# Patient Record
Sex: Female | Born: 1950
Health system: Southern US, Community
[De-identification: ages and names within clinical notes are randomized; demographics above are authoritative.]

## PROBLEM LIST (undated history)

## (undated) DIAGNOSIS — Z9889 Other specified postprocedural states: Secondary | ICD-10-CM

## (undated) DIAGNOSIS — R112 Nausea with vomiting, unspecified: Secondary | ICD-10-CM

## (undated) DIAGNOSIS — K219 Gastro-esophageal reflux disease without esophagitis: Secondary | ICD-10-CM

## (undated) DIAGNOSIS — R42 Dizziness and giddiness: Secondary | ICD-10-CM

## (undated) DIAGNOSIS — M1990 Unspecified osteoarthritis, unspecified site: Secondary | ICD-10-CM

## (undated) HISTORY — PX: REDUCTION MAMMAPLASTY: SUR839

## (undated) HISTORY — DX: Nausea with vomiting, unspecified: Z98.890

## (undated) HISTORY — PX: HERNIA REPAIR: SHX51

## (undated) HISTORY — DX: Nausea with vomiting, unspecified: R11.2

## (undated) HISTORY — PX: TONSILLECTOMY AND ADENOIDECTOMY: SUR1326

---

## 1999-03-04 HISTORY — PX: ABDOMINAL HYSTERECTOMY: SHX81

## 1999-06-28 ENCOUNTER — Encounter: Admission: RE | Admit: 1999-06-28 | Discharge: 1999-06-28 | Payer: Self-pay | Admitting: Obstetrics and Gynecology

## 1999-06-28 ENCOUNTER — Encounter: Payer: Self-pay | Admitting: Obstetrics and Gynecology

## 1999-07-04 ENCOUNTER — Other Ambulatory Visit: Admission: RE | Admit: 1999-07-04 | Discharge: 1999-07-04 | Payer: Self-pay | Admitting: Obstetrics and Gynecology

## 1999-07-04 ENCOUNTER — Encounter (INDEPENDENT_AMBULATORY_CARE_PROVIDER_SITE_OTHER): Payer: Self-pay

## 2000-08-04 ENCOUNTER — Other Ambulatory Visit: Admission: RE | Admit: 2000-08-04 | Discharge: 2000-08-04 | Payer: Self-pay | Admitting: Obstetrics and Gynecology

## 2001-05-13 ENCOUNTER — Encounter (INDEPENDENT_AMBULATORY_CARE_PROVIDER_SITE_OTHER): Payer: Self-pay | Admitting: Specialist

## 2001-05-13 ENCOUNTER — Inpatient Hospital Stay (HOSPITAL_COMMUNITY): Admission: RE | Admit: 2001-05-13 | Discharge: 2001-05-14 | Payer: Self-pay | Admitting: Obstetrics and Gynecology

## 2002-02-11 ENCOUNTER — Encounter: Payer: Self-pay | Admitting: Obstetrics and Gynecology

## 2002-02-11 ENCOUNTER — Encounter: Admission: RE | Admit: 2002-02-11 | Discharge: 2002-02-11 | Payer: Self-pay | Admitting: Obstetrics and Gynecology

## 2003-02-20 ENCOUNTER — Encounter: Admission: RE | Admit: 2003-02-20 | Discharge: 2003-02-20 | Payer: Self-pay | Admitting: Obstetrics and Gynecology

## 2003-03-04 HISTORY — PX: BREAST SURGERY: SHX581

## 2003-09-21 ENCOUNTER — Encounter (INDEPENDENT_AMBULATORY_CARE_PROVIDER_SITE_OTHER): Payer: Self-pay | Admitting: *Deleted

## 2003-09-21 ENCOUNTER — Ambulatory Visit (HOSPITAL_BASED_OUTPATIENT_CLINIC_OR_DEPARTMENT_OTHER): Admission: RE | Admit: 2003-09-21 | Discharge: 2003-09-21 | Payer: Self-pay | Admitting: Plastic Surgery

## 2003-09-21 ENCOUNTER — Ambulatory Visit (HOSPITAL_COMMUNITY): Admission: RE | Admit: 2003-09-21 | Discharge: 2003-09-21 | Payer: Self-pay | Admitting: Plastic Surgery

## 2004-08-01 ENCOUNTER — Encounter: Admission: RE | Admit: 2004-08-01 | Discharge: 2004-08-01 | Payer: Self-pay | Admitting: Obstetrics and Gynecology

## 2004-09-19 ENCOUNTER — Ambulatory Visit: Payer: Self-pay | Admitting: Internal Medicine

## 2005-10-13 ENCOUNTER — Encounter: Admission: RE | Admit: 2005-10-13 | Discharge: 2005-10-13 | Payer: Self-pay | Admitting: Obstetrics and Gynecology

## 2007-06-16 ENCOUNTER — Encounter: Admission: RE | Admit: 2007-06-16 | Discharge: 2007-06-16 | Payer: Self-pay | Admitting: Obstetrics and Gynecology

## 2008-07-27 ENCOUNTER — Encounter: Admission: RE | Admit: 2008-07-27 | Discharge: 2008-07-27 | Payer: Self-pay | Admitting: Obstetrics and Gynecology

## 2008-08-09 ENCOUNTER — Encounter: Admission: RE | Admit: 2008-08-09 | Discharge: 2008-08-09 | Payer: Self-pay | Admitting: Obstetrics and Gynecology

## 2008-09-01 ENCOUNTER — Ambulatory Visit: Payer: Self-pay | Admitting: Internal Medicine

## 2008-09-07 ENCOUNTER — Ambulatory Visit: Payer: Self-pay | Admitting: Internal Medicine

## 2009-03-01 ENCOUNTER — Encounter: Admission: RE | Admit: 2009-03-01 | Discharge: 2009-03-01 | Payer: Self-pay | Admitting: Internal Medicine

## 2009-08-21 ENCOUNTER — Encounter: Admission: RE | Admit: 2009-08-21 | Discharge: 2009-08-21 | Payer: Self-pay | Admitting: Obstetrics and Gynecology

## 2010-03-02 ENCOUNTER — Ambulatory Visit
Admission: RE | Admit: 2010-03-02 | Discharge: 2010-03-02 | Payer: Self-pay | Source: Home / Self Care | Attending: Internal Medicine | Admitting: Internal Medicine

## 2010-03-02 DIAGNOSIS — M949 Disorder of cartilage, unspecified: Secondary | ICD-10-CM

## 2010-03-02 DIAGNOSIS — K219 Gastro-esophageal reflux disease without esophagitis: Secondary | ICD-10-CM | POA: Insufficient documentation

## 2010-03-02 DIAGNOSIS — M899 Disorder of bone, unspecified: Secondary | ICD-10-CM | POA: Insufficient documentation

## 2010-04-04 NOTE — Assessment & Plan Note (Signed)
Summary: COLD AND CONGESTION/EVM   Vital Signs:  Patient Profile:   60 Years Old Female CC:      Cold symptoms Height:     60 inches Weight:      136 pounds O2 Sat:      98 % O2 treatment:    Room Air Temp:     98.0 degrees F Pulse rate:   87 / minute BP sitting:   127 / 81  (right arm)  Vitals Entered By: Haze Boyden, CMA (March 02, 2010 11:23 AM)                  Current Allergies (reviewed today): ! ERYTHROMYCIN BASE (ERYTHROMYCIN BASE)History of Present Illness Chief Complaint: Cold symptoms x 3 days History of Present Illness: clear nasal discharge has become sl purulent today. occas aches not worsening. cough is dry and not keeping her awake. Partial response to tylenol cold. Exposed to granddaughter with similar and she has responded to amoxil.  REVIEW OF SYSTEMS Constitutional Symptoms      Denies fever, chills, night sweats, weight loss, weight gain, and fatigue.  Eyes       Denies change in vision, eye pain, eye discharge, glasses, contact lenses, and eye surgery. Ear/Nose/Throat/Mouth       Complains of frequent runny nose, sore throat, and hoarseness.      Denies hearing loss/aids, change in hearing, ear pain, ear discharge, dizziness, frequent nose bleeds, sinus problems, and tooth pain or bleeding.      Comments: st and hoarseness better Respiratory       Complains of dry cough.      Denies productive cough, wheezing, shortness of breath, asthma, bronchitis, and emphysema/COPD.  Cardiovascular       Denies murmurs, chest pain, and tires easily with exhertion.    Gastrointestinal       Denies stomach pain, nausea/vomiting, diarrhea, constipation, blood in bowel movements, and indigestion. Genitourniary       Denies painful urination, kidney stones, and loss of urinary control. Neurological       Denies paralysis, seizures, and fainting/blackouts. Musculoskeletal       Complains of joint stiffness.      Denies muscle pain, joint pain, decreased range of  motion, redness, swelling, muscle weakness, and gout.      Comments: actually the ache of current syn. Skin       Denies bruising, unusual mles/lumps or sores, and hair/skin or nail changes.  Psych       Denies mood changes, temper/anger issues, anxiety/stress, speech problems, depression, and sleep problems. Other Comments: The patient denies anorexia, fever, weight loss, weight gain, vision loss, decreased hearing, hoarseness, chest pain, syncope, dyspnea on exertion, peripheral edema, prolonged cough, headaches, hemoptysis, abdominal pain, melena, hematochezia, severe indigestion/heartburn, hematuria, incontinence, genital sores, muscle weakness, suspicious skin lesions, transient blindness, difficulty walking, depression, unusual weight change, abnormal bleeding, enlarged lymph nodes, angioedema, breast masses, and testicular masses.     Past History:  Past Medical History: GERD Osteopenia  Past Surgical History: breast reduction - 2005 Hysterectomy- 2001  Medications Prior to Update: 1)  None  Current Medications (verified): 1)  Oscal 500/200 D-3 500-200 Mg-Unit Tabs (Calcium Carbonate-Vitamin D) .... Yake 1 Tablet Daily 2)  One A Day Multivitamin .... Take 1 Table By Mouth Daily 3)  B12 Over The Counter .... Take 1 Tablet By Mouth Daily 4)  Omeprazole 20 Mg Cpdr (Omeprazole) .... Take 1 Tablet By Mouth As Needed 5)  Biotin 300 Mcg  Tabs (Biotin) .... Take 1 Tablet By Mouth Daily 6)  Amoxicillin 500 Mg Caps (Amoxicillin) .Marland Kitchen.. 1 By Mouth Tid  Allergies (verified): 1)  ! 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 Married Never Smoked Alcohol use-no Drug use-no Regular exercise-no Smoking Status:  never Drug Use:  no Does Patient Exercise:  no  Review of Systems  The patient denies anorexia, fever, weight loss, weight gain, vision loss, decreased  hearing, hoarseness, chest pain, syncope, dyspnea on exertion, peripheral edema, prolonged cough, headaches, hemoptysis, abdominal pain, melena, hematochezia, severe indigestion/heartburn, hematuria, incontinence, genital sores, muscle weakness, suspicious skin lesions, transient blindness, difficulty walking, depression, unusual weight change, abnormal bleeding, enlarged lymph nodes, angioedema, breast masses, and testicular masses.    Physical Exam General appearance: well developed, well nourished, no acute distress Head: normocephalic, atraumatic Eyes: conjunctivae and lids normal Ears: normal, no lesions or deformities Nasal: swollen red turbinates with congestion Oral/Pharynx: tongue normal, posterior pharynx with clear pnd Neck: neck supple,  trachea midline, no masses. sl tender but non palp ac nodes Chest/Lungs: no rales, wheezes, or rhonchi bilateral, breath sounds equal without effort Extremities: normal extremities Neurological: grossly intact and non-focal Skin: no obvious rashes MSE: oriented to time, place, and person Assessment New Problems: OSTEOPENIA (ICD-733.90) GERD (ICD-530.81) UPPER RESPIRATORY INFECTION, ACUTE (ICD-465.9)   Plan New Medications/Changes: AMOXICILLIN 500 MG CAPS (AMOXICILLIN) 1 by mouth tid  #30 x 0, 03/02/2010, J. Juline Patch MD   The patient and/or caregiver has been counseled thoroughly with regard to medications prescribed including dosage, schedule, interactions, rationale for use, and possible side effects and they verbalize understanding.  Diagnoses and expected course of recovery discussed and will return if not improved as expected or if the condition worsens. Patient and/or caregiver verbalized understanding.  Prescriptions: AMOXICILLIN 500 MG CAPS (AMOXICILLIN) 1 by mouth tid  #30 x 0   Entered and Authorized by:   J. Juline Patch MD   Signed by:   Shela Commons. Juline Patch MD on 03/02/2010   Method used:   Electronically to        [id number] (ret[facility])       [address]       [place], [place]  [address]       Ph: [phone]       Fax: [phone]   RxID:   [id number]   Patient Instructions: 1)  Get plenty of rest, drink lots of clear liquids, and use Tylenol or Ibuprofen for fever and comfort. Return in 7-10 days if you're not better:sooner if you're feeling worse. 2)  loratidine-D every 12 hours. 3)  afrin generic 2 sprays in each nostril every 12 hours for 3 days maximum followed by 3 days of non-use. Continue cycle as needed to control nasal or ear congestion . 4)  start antibiotic if worse and take it all. 5)  Please schedule a follow-up appointment as needed. Dr.Gates

## 2010-07-19 NOTE — Op Note (Signed)
NAME:  Mandy Guzman, Mandy Guzman                        ACCOUNT NO.:  [id number]   MEDICAL RECORD NO.:  [id number]                   PATIENT TYPE:  AMB   LOCATION:  DSC                                  FACILITY:  [facility]   PHYSICIAN:  Alfredia Ferguson, M.D.               DATE OF BIRTH:  1950/04/01   DATE OF PROCEDURE:  09/21/2003  DATE OF DISCHARGE:                                 OPERATIVE REPORT   PREOPERATIVE DIAGNOSIS:  Bilateral symptomatic macromastia.   POSTOPERATIVE DIAGNOSIS:  Bilateral symptomatic macromastia.   PROCEDURE:  Bilateral reduction mammoplasty with inferior pedicle technique,  608 grams of breast tissue removed from the right breast and 598 grams  removed from the left breast.   SURGEON:  Alfredia Ferguson, M.D.   ASSISTANT:  Vevelyn Francois, R.N.F.A.   ANESTHESIA:  General endotracheal anesthesia.   INDICATIONS FOR PROCEDURE:  This is a 60 year old woman who complains of  many years of symptomatic macromastia including upper back and neck  discomfort.  She has bra strap grooving into her shoulders.  She wishes to  be reduced from her present DD to a B cup if possible.  The patient  understands there are risks associated with, including, but not limited to  overreduction, underreduction, asymmetry, bleeding, infection, hematoma,  seroma, unsightly scarring, the need for secondary surgery, overall  dissatisfaction with the results.  Inspite of these and other risks  discussed with her, she wishes to proceed with a breast reduction.   DESCRIPTION OF PROCEDURE:  On the day prior to surgery, skin marks were  placed outlining a Wise skin pattern for an inferior pedicle technique.  The  patient was taken to the operating room this morning where she was given  general endotracheal anesthesia.  Her chest was prepped with Betadine and  draped with sterile drapes.  Attention was first directed to the left  breast.  A 42 mm diameter circle was drawn around the nipple on the  areola.  An 8 cm wide inferiorly based pedicle was also marked within the confines of  the previously placed Wise pattern skin marks.  The pedicle was  deepithelialized with the exception of the nipple areolar complex.  The  remainder of my incisions were now made and the medial, superior, and  lateral breast flaps including the excess dermoglandular tissue was  dissected off the pectoralis fascia.  The pedicle was left in place with  wide connections to the chest wall after performing the elevation of the  medial, superior, and lateral breast flaps.  Hemostasis was meticulously  maintained.  The excess dermoglandular tissue was dissected away from the  medial breast flap leaving the medial breast flap approximately 4 cm in  thickness.  The excess tissue was further dissected in continuity, thinning  the superior breast flap to approximately 5 cm in thickness.  The excess  tissue dissection continued laterally thinning the lateral breast flap to  approximately 2 to 3 cm in thickness.  Excess tissue was dissected away from  these flaps using electrocautery.  The tissue was passed off for weights.  A  total of 598 grams removed from the left side.  The wound was copiously  irrigated with saline irrigation.  Hemostasis was ensured and the inferior  corner of the medial and lateral breast flap will be united to the  midportion of the inframammary crease incision with a 2-0 Vicryl suture.  The superior corner of the medial and lateral breast flap will be united to  each other with a similar suture.  The nipple areolar complex was placed in  its new location and fixed in position using interrupted 3-0 Monocryl  suture.  The remainder of the incision was temporarily stapled and attention  was directed to her right breast where an identical procedure was performed.  608 grams of breast tissue was removed from the right breast.  After  ensuring hemostasis and irrigation of the right side, closure  was carried  out of the two breasts in simultaneous fashion.  Once the inferior corner of  the medial and lateral breast flaps were united to the inframammary crease  with a 2-0 Vicryl followed by the superior corner, the remainder of the  breast incision was closed.  The inframammary crease incision was closed  using interrupted 3-0 Monocryl sutures for the dermis followed by a running  3-0 Monocryl subcuticular.  The vertical limb of the medial and lateral  breast flaps were united to each other with 3-0 Monocryl sutures for the  dermis.  The nipple areolar complex was fixed in position using interrupted  3-0 Monocryl for the dermis followed by running 4-0 Monocryl subcuticular.  Symmetry appeared to be acceptable, although, the right breast remained ever  so slightly larger than the left breast, but not necessary in my opinion to  further reduce it.  The nipple areolar complex color was excellent with good  capillary refill.  The patient's chest was cleansed and dried and a bulky  dressing was placed over the breast followed by circumferential wrap with an  Ace bandage.  The patient was awakened, extubated, and transported to the  recovery room in satisfactory condition.                                               Alfredia Ferguson, M.D.    WBB/MEDQ  D:  09/21/2003  T:  09/21/2003  Job:  [id number]

## 2010-07-19 NOTE — Op Note (Signed)
[facility]  Patient:    Mandy Guzman, Mandy Guzman Visit Number: [id number] MRN: [id number]          Service Type: GYN Location: 4W [id number] 01 Attending Physician:  Huel Cote Dictated by:   Alvino Chapel, M.D. Proc. Date: 05/13/01 Admit Date:  05/13/2001 Discharge Date: 05/14/2001                             Operative Report  PREOPERATIVE DIAGNOSIS:   Pelvic pressure with prolapse, cystocele and rectocele.  POSTOPERATIVE DIAGNOSIS:  Pelvic pressure with prolapse, cystocele and rectocele.  OPERATION:  Laparoscopically-assisted vaginal hysterectomy and bilateral salpingo-oophorectomy.  Anterior and posterior repair.  SURGEON:  Alvino Chapel, M.D.  ASSISTANT:  Malachi Pro. Ambrose Mantle, M.D.  ANESTHESIA:  General.  ESTIMATED BLOOD LOSS:  200 cc.  INTRAVENOUS FLUIDS:  2700 cc LR.  URINE OUTPUT:  400 cc clear urine.  FINDINGS:  There was a normal uterus noted.  The ovaries were normal bilaterally.  There was a right paratubal cyst noted and the patient did have a shallow posterior cul-de-sac with a large cystocele and a mild rectocele noticed.  DESCRIPTION OF PROCEDURE:  The patient was taken to the operating room where general anesthesia was obtained without difficulty.  She was then prepped and draped in the normal sterile fashion in the dorsal lithotomy position.  A speculum was then placed in the vagina.  The cervix was identified and a Hulka tenaculum was placed within it for uterine manipulation.  A Foley catheter was also placed, and attention was then turned to the patients abdomen where a small infraumbilical incision was made with the scalpel after injection with 0.25% Marcaine locally.  The Verres needle was then introduced into the abdomen and intraperitoneal placement confirmed by opening pressure with gas flow applied.  The pneumoperitoneum was then obtained with approximately 2.5 liters of CO2 gas.  The Verres needle  was removed, and the 10-11 trocar placed within the incision.  The abdomen and pelvis and then inspected with the findings as previously stated.  The two additional trocars were then placed with in each lower quadrant under direct visualization.  They were 5 mm in size.  With these trocars then placed, the uterus was elevated and the left fallopian tube and ovary grasped with an atraumatic grasped and pulled medially.  This exposed the infundibulopelvic ligament nicely and it was then taken down sequentially with the bipolar cutting cautery unit with good hemostasis noted.  The remainder of the broad ligament was then taken down just lateral to the fallopian tube and this was carried down to the level of the round ligament which was also transected with the cautery unit.  The bladder flap was then also created using the bipolar cutting cautery unit and good exposure of the cervix noted.  In a similar fashion, the right fallopian tube and ovary were grasped and retracted medially exposing the infundibulopelvic ligament.  This area was then taken down with the cutting cautery unit down through the infundibulopelvic and the broad ligament to the level of the bladder flap.  The bladder flap was also created on this side and good reflection noted.  At this point, there was no active bleeding noted. Therefore, the instruments were removed from the abdomen.  The trocars were covered with a clean towel and attention turned to the patients vagina.  A weighted speculum was then placed within the vagina.  The Hulka tenaculum was  removed and Jacobson tenaculum was utilized to clamp the anterior and posterior lip of the cervix.  A dilute solution of Pitressin was then instilled just beneath the cervical mucosa circumferentially.  The Bovie cautery was then utilized to create circumferential incision approximately 1 cm back on the cervix itself.  The Mayo scissors were then used to further develop this  dissection of vaginal mucosa from the underlying cervix.  The posterior cul-de-sac was then entered with Mayo scissors and the banana speculum placed within it.  Each uterosacral ligament was then taken down with a slightly curved Zeppelin clamp, transected and suture ligated with 0 Vicryl at each step.  One additional bite was taken down of the broad ligament in order that the anterior cul-de-sac may be reached easier.  Each of these bites were also secured with a suture ligature of 0 Vicryl.  The anterior cul-de-sac was then entered, and the Sims retractor was placed within it. Thus with the uterus isolated the remainder of the cardinal and broad ligament was taken down with sequential bites of the slightly curved Zeppelin clamp.  Each step again was suture ligated with 0 Vicryl.  This was carried until the uterus had very little broad ligament intervening.  The uterus was then delivered from the vagina, and the remaining broad ligament was clamped with Zeppelin clamp, and the uterus completely amputated.  These last clamps were secured with two free ties of 0 Vicryl, and the patient was then observed for bleeding.  There was no active bleeding noted except along the posterior cuff which was secured with two figure-of-eight sutures of 0 Vicryl and became hemostatic.  A pursestring suture was then utilized to close the peritoneum.  With this tied down and no active bleeding note, the uterosacral ligaments which had been previously clamped were tied to one another.  Attention was then turned to the anterior vaginal wall which was clamped at the vaginal cuff with Allis clamps. Midline dissection was then performed of the vaginal mucosa.  At each step, the vaginal mucosa was reflected with Allis clamps.  This was carried up to approximately 1 cm below the urethral meatus.  The pubovesical fascia was then both sharply and bluntly dissected off the vaginal mucosa flaps and the patients cystocele  reduced in that fashion.  With the pubovaginal fascia dissected off the mucosa to the midline several Kelly plication sutures of 0  Vicryl were placed in the fascia itself.  The fascia then had been successfully reapproximated and the excess vaginal mucosa was trimmed away with Metzenbaum scissors.  The remaining vaginal mucosa was then closed with 2-0 Vicryl in a running locked suture.  The vaginal cuff was then closed with 0 Vicryl in a running locked suture.  The posterior wall of the vaginal was then grasped at the introitus with two Allis clamps and a small section of vaginal mucosa trimmed away.  This was then underscored with the Metzenbaum scissors along the midline with the vaginal mucosa flaps being elevated with Allis clamps at each step.  This was carried to approximately 2 inches into the vagina and the underlying rectovaginal fascia was then both sharply and bluntly dissected off the vaginal mucosa flaps.  This fascia was then reapproximated with 0 Vicryl in several interrupted sutures.  Finally, the posterior vaginal wall was closed with 2-0 Vicryl in a running locked fashion. At this point there was no active bleeding noted vaginally.  Therefore, Estrace coated gauze was placed within the vagina for vaginal packing  and attention was then returned to the patients abdomen where the camera was then reintroduced into the umbilical trocar.  There was some bleeding noted at the right aspect of the vaginal cuff.  The _____ was utilized to irrigate copiously the cuff itself and the bipolar cautery unit was then used also to cauterize the areas of bleeding along the right cuff and sidewall.  At the conclusion of the procedure there was no active bleeding noted, and the pelvis and abdomen were irrigated several times.  It was noted that the posterior cul-de-sac was quite shallow.  The 5 mm trocars were then removed under direct visualization and the pneumoperitoneum partially reduced.   Again, the vaginal cuff was examined and found to be hemostatic with much of the pressure of the pneumoperitoneum relieved.  The remainder of the pneumoperitoneum was then evacuated and the umbilical trocar removed.  The umbilical trocar was then closed with figure-of-eight suture of 0 Vicryl in the deep layers and with 3-0 Vicryl in a subcuticular stitch.  The 5 mm trocar sites were also closed with 3-0 Vicryl in a subcuticular stitch.  Sponge, lap and needle counts were correct x2, and the patient was taken to the recovery room in stable condition. Dictated by:   Alvino Chapel, M.D. Attending Physician:  Huel Cote DD:  05/13/01 TD:  05/15/01 Job: [id number]

## 2010-07-19 NOTE — H&P (Signed)
[facility]  Patient:    Mandy Guzman, Mandy Guzman Visit Number: [id number] MRN: [id number]          Service Type: Attending:  Alvino Chapel, M.D. Dictated by:   Alvino Chapel, M.D. Adm. Date:  05/13/01                           History and Physical  DATE OF SURGERY:  May 13, 2001 at 12:30 p.m. in [place] facility.  HISTORY OF PRESENT ILLNESS:  The patient is a 60 year old G2, P2 who has been having an ongoing problem with increasing pressure and discomfort related to her cystocele.  The patient denies any symptoms of stress urinary incontinence and had a urological evaluation prior to this surgery and was not found to have a significant component of stress urinary incontinence.  The patient is essentially menopausal and has been on Prempro for approximately two years. She has had an occasional period over the last two years, however, for the most part has remained amenorrheic.  PAST MEDICAL HISTORY:  None.  PAST SURGICAL HISTORY:  Tonsillectomy.  PAST GYN HISTORY:  No abnormal Pap smears.  PAST OBSTETRICAL HISTORY:  She had two normal spontaneous vaginal deliveries.  CURRENT MEDICATIONS: 1. Prempro 2.5 mg. 2. Nexium.  ALLERGIES:  No known drug allergies.  FAMILY HISTORY:  Insignificant.  PHYSICAL EXAMINATION:  VITAL SIGNS:  Weight 139 pounds.  Blood pressure 110/80.  CARDIAC:  Regular rate and rhythm.  LUNGS:  Clear to auscultation bilaterally.  ABDOMEN:  Soft and nontender.  PELVIC EXAM:  She has normal external genitalia with a very large cystocele noted, both present with Valsalva and without.  There is also a moderate rectocele and mild uterine prolapse.  IMPRESSION: As stated the patient underwent considerable urological evaluation and was found to have no significant stress urinary incontinence and did not need any formal sling procedure.  For that reason she was consented for a laparoscopic-assisted vaginal  hysterectomy and bilateral salpingo-oophorectomy with anterior-posterior repair.  The patient was counseled of the risks and benefits of this procedure including bleeding, infection and possible damage to bowel and bladder.  She understands these risks and desires to proceed. The patient definitively wishes her ovaries removed and, therefore, they will be removed laparoscopically prior to the vaginal hysterectomy. Dictated by:   Alvino Chapel, M.D. Attending:  Alvino Chapel, M.D. DD:  05/10/01 TD:  05/10/01 Job: [id number]

## 2010-07-19 NOTE — Discharge Summary (Signed)
[facility]  Patient:    Mandy Guzman, Mandy Guzman Visit Number: [id number] MRN: [id number]          Service Type: GYN Location: 4W [id number] 01 Attending Physician:  Oliver Pila Dictated by:   Alvino Chapel, M.D. Admit Date:  05/13/2001 Discharge Date: 05/14/2001                             Discharge Summary  DISCHARGE DIAGNOSES: 1. Pelvic prolapse. 2. Cystocele. 3. Rectocele. 4. Status post laparoscopic-assisted vaginal hysterectomy, bilateral    salpingo-oophorectomy, and anterior-posterior repair.  DISCHARGE MEDICATIONS: 1. Motrin 600 mg p.o. every 6 hours. 2. Percocet one to two tablets p.o. every 4 hours p.r.n.  DISCHARGE FOLLOW-UP:  Patient is to follow up in the office in approximately two weeks for her postoperative check.  HOSPITAL COURSE:  Patient is a 60 year old, G2, P2 who had an ongoing problem with increasing pressure and discomfort related to her pelvic prolapse and cystocele.  For this reason, she underwent a scheduled laparoscopcic-assisted vaginal hysterectomy, bilateral salpingo-oophorectomy, anterior, posterior repair.  Urological evaluation prior to surgery did not reveal any urethral hypermobility.  Patient underwent surgery without complication and was then admitted for routine postoperative care.  The evening following surgery, she did have some hypotension with blood pressure noted to be 80s to 90s/40s to 50s.  However, pulse rate was stable and urine output excellent.  Hemoglobin checked in the recovery room was normal at 11.2.  Preoperative hemoglobin was 13.9.  On postoperative day #1, the patient was feeling very well.  She had no nausea or vomiting.  Postoperative hemoglobin on that morning was 10.6.  Urine output was excellent at 2400 cc.  Incisions were clear.  Throughout the day, she transitioned well to a regular diet with no nausea and vomiting, and was able to take her medications by mouth.  Her  blood pressures improved to her normal 100s/60s and she was able to void without difficulty.  For this reason, she was felt stable for discharge and was discharged to home to follow up in two weeks in the office. Dictated by:   Alvino Chapel, M.D. Attending Physician:  Oliver Pila DD:  05/14/01 TD:  05/17/01 Job: [phone]

## 2010-09-26 ENCOUNTER — Other Ambulatory Visit: Payer: Self-pay | Admitting: Obstetrics and Gynecology

## 2010-09-26 DIAGNOSIS — Z1231 Encounter for screening mammogram for malignant neoplasm of breast: Secondary | ICD-10-CM

## 2010-10-03 ENCOUNTER — Ambulatory Visit
Admission: RE | Admit: 2010-10-03 | Discharge: 2010-10-03 | Disposition: A | Discharge status: Home / Self Care | Payer: BC Managed Care – PPO | Source: Ambulatory Visit | Attending: Obstetrics and Gynecology | Admitting: Obstetrics and Gynecology

## 2010-10-03 DIAGNOSIS — Z1231 Encounter for screening mammogram for malignant neoplasm of breast: Secondary | ICD-10-CM

## 2011-11-21 ENCOUNTER — Other Ambulatory Visit: Payer: Self-pay | Admitting: Obstetrics and Gynecology

## 2011-11-21 DIAGNOSIS — Z1231 Encounter for screening mammogram for malignant neoplasm of breast: Secondary | ICD-10-CM

## 2011-12-08 ENCOUNTER — Ambulatory Visit
Admission: RE | Admit: 2011-12-08 | Discharge: 2011-12-08 | Disposition: A | Discharge status: Home / Self Care | Payer: BC Managed Care – PPO | Source: Ambulatory Visit | Attending: Obstetrics and Gynecology | Admitting: Obstetrics and Gynecology

## 2011-12-08 DIAGNOSIS — Z1231 Encounter for screening mammogram for malignant neoplasm of breast: Secondary | ICD-10-CM

## 2013-02-09 ENCOUNTER — Other Ambulatory Visit: Payer: Self-pay

## 2013-02-09 DIAGNOSIS — Z1231 Encounter for screening mammogram for malignant neoplasm of breast: Secondary | ICD-10-CM

## 2013-03-08 ENCOUNTER — Ambulatory Visit
Admission: RE | Admit: 2013-03-08 | Discharge: 2013-03-08 | Disposition: A | Discharge status: Home / Self Care | Payer: BC Managed Care – PPO | Source: Ambulatory Visit

## 2013-03-08 DIAGNOSIS — Z1231 Encounter for screening mammogram for malignant neoplasm of breast: Secondary | ICD-10-CM

## 2013-09-27 ENCOUNTER — Emergency Department (HOSPITAL_COMMUNITY)
Admission: EM | Admit: 2013-09-27 | Discharge: 2013-09-27 | Disposition: A | Discharge status: Home / Self Care | Payer: BC Managed Care – PPO | Source: Home / Self Care | Attending: Family Medicine | Admitting: Family Medicine

## 2013-09-27 ENCOUNTER — Encounter (HOSPITAL_COMMUNITY): Payer: Self-pay | Admitting: Emergency Medicine

## 2013-09-27 DIAGNOSIS — K625 Hemorrhage of anus and rectum: Secondary | ICD-10-CM

## 2013-09-27 DIAGNOSIS — R197 Diarrhea, unspecified: Secondary | ICD-10-CM

## 2013-09-27 HISTORY — DX: Dizziness and giddiness: R42

## 2013-09-27 LAB — POCT I-STAT, CHEM 8
BUN: 21 mg/dL (ref 6–23)
CREATININE: 0.8 mg/dL (ref 0.50–1.10)
Calcium, Ion: 1.13 mmol/L (ref 1.13–1.30)
Chloride: 101 mEq/L (ref 96–112)
GLUCOSE: 113 mg/dL — AB (ref 70–99)
HCT: 45 % (ref 36.0–46.0)
HEMOGLOBIN: 15.3 g/dL — AB (ref 12.0–15.0)
POTASSIUM: 3.5 meq/L — AB (ref 3.7–5.3)
Sodium: 137 mEq/L (ref 137–147)
TCO2: 24 mmol/L (ref 0–100)

## 2013-09-27 MED ORDER — LOPERAMIDE HCL 2 MG PO CAPS: 2.0000 mg | ORAL_CAPSULE | Freq: Four times a day (QID) | ORAL | Status: DC | PRN

## 2013-09-27 NOTE — ED Provider Notes (Signed)
Medical screening examination/treatment/procedure(s) were performed by a resident physician or non-physician practitioner and as the supervising physician I was immediately available for consultation/collaboration.  David Merrell, MD Family Medicine   David J Merrell, MD 09/27/13 2129 

## 2013-09-27 NOTE — ED Provider Notes (Signed)
CSN: [id number]     Arrival date & time 09/27/13  1641 History   First MD Initiated Contact with Patient 09/27/13 1815     Chief Complaint  Patient presents with  . Rectal Bleeding   (Consider location/radiation/quality/duration/timing/severity/associated sxs/prior Treatment) HPI Comments: 63 year old female presents for evaluation of abdominal cramping with diarrhea and rectal bleeding. This started earlier today, she had some cramping followed by profuse watery diarrhea. This happened a couple more times, and eventually she started to see a very small amount of blood mixed in with the diarrhea.  about 4 hours she had an episode of cramping in which she only experienced a few drops of blood and the toilet, no diarrhea. She called her doctor who could not see her and directed her to come here. Right now, she feels completely fine. She denies any abdominal pain, dizziness, or any other systemic symptoms. She last had an episode of diarrhea after she got here in the waiting room, she reports a very small amount of blood mixed with this. No previous history of bleeding or colitis, and last colonoscopy was 7 years ago and entirely normal, not even any polyps. She is currently taking prednisone for poison ivy, she had diarrhea earlier in the week also right after she started the prednisone   Patient is a 63 y.o. female presenting with hematochezia.  Rectal Bleeding Associated symptoms: abdominal pain   Associated symptoms: no fever and no vomiting     Past Medical History  Diagnosis Date  . Vertigo    Past Surgical History  Procedure Laterality Date  . Abdominal hysterectomy  2001  . Breast surgery  2005    breast reduction   Family History  Problem Relation Age of Onset  . Heart failure Mother    History  Substance Use Topics  . Smoking status: Never Smoker   . Smokeless tobacco: Not on file  . Alcohol Use: No   OB History   Grav Para Term Preterm Abortions TAB SAB Ect Mult Living                 Review of Systems  Constitutional: Negative for fever and chills.  Gastrointestinal: Positive for abdominal pain, diarrhea, blood in stool, hematochezia and anal bleeding. Negative for nausea, vomiting, constipation and rectal pain.  All other systems reviewed and are negative.   Allergies  Erythromycin base  Home Medications   Prior to Admission medications   Medication Sig Start Date End Date Taking? Authorizing Provider  calcium carbonate (OS-CAL) 600 MG TABS tablet Take 600 mg by mouth daily with breakfast.   Yes Historical Provider, MD  Cholecalciferol (VITAMIN D) 1000 UNITS capsule Take 1,000 Units by mouth daily.   Yes Historical Provider, MD  Cyanocobalamin (VITAMIN B 12 PO) Take by mouth 2 (two) times a week.   Yes Historical Provider, MD  Multiple Vitamins-Minerals (MULTIVITAMIN WITH MINERALS) tablet Take 1 tablet by mouth daily. One a Day 50 plus   Yes Historical Provider, MD  omeprazole (PRILOSEC) 20 MG capsule Take 20 mg by mouth daily as needed.   Yes Historical Provider, MD  predniSONE (DELTASONE) 10 MG tablet Take 10 mg by mouth daily with breakfast.   Yes Historical Provider, MD  loperamide (IMODIUM) 2 MG capsule Take 1 capsule (2 mg total) by mouth 4 (four) times daily as needed for diarrhea or loose stools. 09/27/13   Adrian BlackwaterZachary H Madex Seals, PA-C   BP 147/78  Pulse 69  Temp(Src) 98.1 F (36.7 C) (Oral)  Resp 16  SpO2 100% Physical Exam  Nursing note and vitals reviewed. Constitutional: She is oriented to person, place, and time. Vital signs are normal. She appears well-developed and well-nourished. No distress.  HENT:  Head: Normocephalic and atraumatic.  Pulmonary/Chest: Effort normal. No respiratory distress.  Abdominal: Soft. Bowel sounds are normal. She exhibits no distension and no mass. There is no tenderness. There is no rebound and no guarding.  Genitourinary: Rectal exam shows external hemorrhoid and fissure (small). Rectal exam shows no internal  hemorrhoid. Guaiac positive stool.   The skin surrounding the rectum is erythematous, indurated. The rectal mucosa is erythematous the endoscope, no active bleeding identified, no obvious internal hemorrhoid. No tenderness or masses via DRE.  FOBT positive . Stool in the rectal vault with only a very small amount of blood separate from the stool, not mixed in  Neurological: She is alert and oriented to person, place, and time. She has normal strength. Coordination normal.  Skin: Skin is warm and dry. No rash noted. She is not diaphoretic.  Psychiatric: She has a normal mood and affect. Judgment normal.    ED Course  Procedures (including critical care time) Labs Review Labs Reviewed  POCT I-STAT, CHEM 8 - Abnormal; Notable for the following:    Potassium 3.5 (*)    Glucose, Bld 113 (*)    Hemoglobin 15.3 (*)    All other components within normal limits  OCCULT BLOOD X 1 CARD TO LAB, STOOL    Imaging Review No results found.   MDM   1. Rectal bleeding   2. Diarrhea    H/H normal.  Bleeding minimal.  Exam benign.  Diarrhea may be ADR to prednisone and she has a small amount of bleeding from irritation. The bleeding sounds very minimal as per her history, and she is not orthostatic to suggest any amount of life-threatening bleeding. Also stool was present in the rectal vault with only a very small amount of blood, separate from the stool, not mixed in. She has a gastroenterologist, she will followup there ASAP. She will to the emergency room if she has any worsening of her condition. Imodium for diarrhea. Stop prednisone  New Prescriptions   LOPERAMIDE (IMODIUM) 2 MG CAPSULE    Take 1 capsule (2 mg total) by mouth 4 (four) times daily as needed for diarrhea or loose stools.       Graylon Good, PA-C 09/27/13 [phone]

## 2013-09-27 NOTE — ED Notes (Signed)
C/o stool incontinance into underwear this afternoon.  Went to [facility] she had cramp in abdomen and she passed bright red blood x 2.  Does not know if she has a hemorrhoid.  C/o feels bloated.  She called Dr. Kevan NyGates, she told her to go to an Urgent Care.  Had another pain and passed more blood.

## 2013-09-27 NOTE — Discharge Instructions (Signed)
Diarrhea Diarrhea is frequent loose and watery bowel movements. It can cause you to feel weak and dehydrated. Dehydration can cause you to become tired and thirsty, have a dry mouth, and have decreased urination that often is dark yellow. Diarrhea is a sign of another problem, most often an infection that will not last long. In most cases, diarrhea typically lasts 2-3 days. However, it can last longer if it is a sign of something more serious. It is important to treat your diarrhea as directed by your caregiver to lessen or prevent future episodes of diarrhea. CAUSES  Some common causes include:  Gastrointestinal infections caused by viruses, bacteria, or parasites.  Food poisoning or food allergies.  Certain medicines, such as antibiotics, chemotherapy, and laxatives.  Artificial sweeteners and fructose.  Digestive disorders. HOME CARE INSTRUCTIONS  Ensure adequate fluid intake (hydration): Have 1 cup (8 oz) of fluid for each diarrhea episode. Avoid fluids that contain simple sugars or sports drinks, fruit juices, whole milk products, and sodas. Your urine should be clear or pale yellow if you are drinking enough fluids. Hydrate with an oral rehydration solution that you can purchase at pharmacies, retail stores, and online. You can prepare an oral rehydration solution at home by mixing the following ingredients together:   - tsp table salt.   tsp baking soda.   tsp salt substitute containing potassium chloride.  1  tablespoons sugar.  1 L (34 oz) of water.  Certain foods and beverages may increase the speed at which food moves through the gastrointestinal (GI) tract. These foods and beverages should be avoided and include:  Caffeinated and alcoholic beverages.  High-fiber foods, such as raw fruits and vegetables, nuts, seeds, and whole grain breads and cereals.  Foods and beverages sweetened with sugar alcohols, such as xylitol, sorbitol, and mannitol.  Some foods may be well  tolerated and may help thicken stool including:  Starchy foods, such as rice, toast, pasta, low-sugar cereal, oatmeal, grits, baked potatoes, crackers, and bagels.  Bananas.  Applesauce.  Add probiotic-rich foods to help increase healthy bacteria in the GI tract, such as yogurt and fermented milk products.  Wash your hands well after each diarrhea episode.  Only take over-the-counter or prescription medicines as directed by your caregiver.  Take a warm bath to relieve any burning or pain from frequent diarrhea episodes. SEEK IMMEDIATE MEDICAL CARE IF:   You are unable to keep fluids down.  You have persistent vomiting.  You have blood in your stool, or your stools are black and tarry.  You do not urinate in 6-8 hours, or there is only a small amount of very dark urine.  You have abdominal pain that increases or localizes.  You have weakness, dizziness, confusion, or light-headedness.  You have a severe headache.  Your diarrhea gets worse or does not get better.  You have a fever or persistent symptoms for more than 2-3 days.  You have a fever and your symptoms suddenly get worse. MAKE SURE YOU:   Understand these instructions.  Will watch your condition.  Will get help right away if you are not doing well or get worse. Document Released: 02/07/2002 Document Revised: 07/04/2013 Document Reviewed: 10/26/2011 [facility] Patient Information 2015 [place], [place]. This information is not intended to replace advice given to you by your health care provider. Make sure you discuss any questions you have with your health care provider.  Rectal Bleeding Rectal bleeding is when blood passes out of the anus. It is usually a  sign that something is wrong. It may not be serious, but it should always be evaluated. Rectal bleeding may present as bright red blood or extremely dark stools. The color may range from dark red or maroon to black (like tar). It is important that the cause of  rectal bleeding be identified so treatment can be started and the problem corrected. CAUSES   Hemorrhoids. These are enlarged (dilated) blood vessels or veins in the anal or rectal area.  Fistulas. Theseare abnormal, burrowing channels that usually run from inside the rectum to the skin around the anus. They can bleed.  Anal fissures. This is a tear in the tissue of the anus. Bleeding occurs with bowel movements.  Diverticulosis. This is a condition in which pockets or sacs project from the bowel wall. Occasionally, the sacs can bleed.  Diverticulitis. Thisis an infection involving diverticulosis of the colon.  Proctitis and colitis. These are conditions in which the rectum, colon, or both, can become inflamed and pitted (ulcerated).  Polyps and cancer. Polyps are non-cancerous (benign) growths in the colon that may bleed. Certain types of polyps turn into cancer.  Protrusion of the rectum. Part of the rectum can project from the anus and bleed.  Certain medicines.  Intestinal infections.  Blood vessel abnormalities. HOME CARE INSTRUCTIONS  Eat a high-fiber diet to keep your stool soft.  Limit activity.  Drink enough fluids to keep your urine clear or pale yellow.  Warm baths may be useful to soothe rectal pain.  Follow up with your caregiver as directed. SEEK IMMEDIATE MEDICAL CARE IF:  You develop increased bleeding.  You have black or dark red stools.  You vomit blood or material that looks like coffee grounds.  You have abdominal pain or tenderness.  You have a fever.  You feel weak, nauseous, or you faint.  You have severe rectal pain or you are unable to have a bowel movement. MAKE SURE YOU:  Understand these instructions.  Will watch your condition.  Will get help right away if you are not doing well or get worse. Document Released: 08/09/2001 Document Revised: 05/12/2011 Document Reviewed: 08/04/2010 [facility] Patient Information 2015 [place]. This information is not intended to replace advice given to you by your health care provider. Make sure you discuss any questions you have with your health care provider.

## 2013-09-28 ENCOUNTER — Telehealth: Payer: Self-pay | Admitting: Internal Medicine

## 2013-09-28 ENCOUNTER — Ambulatory Visit (INDEPENDENT_AMBULATORY_CARE_PROVIDER_SITE_OTHER): Payer: BC Managed Care – PPO | Admitting: Nurse Practitioner

## 2013-09-28 ENCOUNTER — Encounter: Payer: Self-pay | Admitting: Nurse Practitioner

## 2013-09-28 VITALS — BP 116/76 | HR 88 | Ht 60.0 in | Wt 129.4 lb

## 2013-09-28 DIAGNOSIS — R197 Diarrhea, unspecified: Secondary | ICD-10-CM

## 2013-09-28 DIAGNOSIS — K648 Other hemorrhoids: Secondary | ICD-10-CM

## 2013-09-28 LAB — OCCULT BLOOD, POC DEVICE: Fecal Occult Bld: POSITIVE — AB

## 2013-09-28 MED ORDER — HYDROCORTISONE ACETATE 25 MG RE SUPP: RECTAL | Status: DC

## 2013-09-28 NOTE — Telephone Encounter (Signed)
Patient will come in and see Willette Clusteraula Guenther RNP today at 2:30.  See ER notes from yesterday

## 2013-09-28 NOTE — Patient Instructions (Signed)
We sent a prescription to [facility] [address] [place] 1. Anusol HC Suppositories  We have given you a Low Fiber diet brochure. Avoid dairy for now.  Call Paula's nurse [place] , [phone] with a condition update. If you are not better she will be calling in antibiotics.

## 2013-09-29 DIAGNOSIS — K648 Other hemorrhoids: Secondary | ICD-10-CM | POA: Insufficient documentation

## 2013-09-29 DIAGNOSIS — R197 Diarrhea, unspecified: Secondary | ICD-10-CM | POA: Insufficient documentation

## 2013-09-29 NOTE — Progress Notes (Signed)
HPI :  Patient is a 63 year old female known to Dr. Leone Payor for colon cancer screenings. Patient is worked in today for evaluation of acute diarrhea with blood. Problems started a couple of days ago when patient had a sudden urge to defecate. First bowel movement was formed but then the patient later had a large loose bowel movement with blood. No significant abdominal pain, just some pre-defecatory  cramps. Patient had a few more loose stools with scant blood over the following few hours. She was evaluated at an urgent care center yesterday. I do not see a white count but her hemoglobin was normal at 15.3. After leaving urgent care patient did not have anymore diarrhea or blood. She had no symptoms through the night but around 1:30 this afternoon did pass what she describes as a couple of drops of blood just prior to a bowel movement. No fevers. No recent antibiotic use  Past Medical History  Diagnosis Date  . Vertigo     Family History  Problem Relation Age of Onset  . Heart failure Mother    History  Substance Use Topics  . Smoking status: Never Smoker   . Smokeless tobacco: Not on file  . Alcohol Use: No   Current Outpatient Prescriptions  Medication Sig Dispense Refill  . calcium carbonate (OS-CAL) 600 MG TABS tablet Take 600 mg by mouth daily with breakfast.      . Cholecalciferol (VITAMIN D) 1000 UNITS capsule Take 1,000 Units by mouth daily.      . Cyanocobalamin (VITAMIN B 12 PO) Take by mouth 2 (two) times a week.      . loperamide (IMODIUM) 2 MG capsule Take 1 capsule (2 mg total) by mouth 4 (four) times daily as needed for diarrhea or loose stools.  12 capsule  0  . Multiple Vitamins-Minerals (MULTIVITAMIN WITH MINERALS) tablet Take 1 tablet by mouth daily. One a Day 50 plus      . hydrocortisone (ANUSOL-HC) 25 MG suppository Use 1 suppository at bedtime for 7 days.  7 suppository  1  . omeprazole (PRILOSEC) 20 MG capsule Take 20 mg by mouth daily as needed.       No  current facility-administered medications for this visit.   Allergies  Allergen Reactions  . Erythromycin Base     REACTION: up set stomach    Review of Systems: All systems reviewed and negative except where noted in HPI.   Physical Exam: BP 116/76  Pulse 88  Ht 5' (1.524 m)  Wt 129 lb 6.4 oz (58.695 kg)  BMI 25.27 kg/m2 Constitutional: Pleasant,well-developed, white female in no acute distress. HEENT: Normocephalic and atraumatic. Conjunctivae are normal. No scleral icterus. Neck supple.  Cardiovascular: Normal rate, regular rhythm.  Pulmonary/chest: Effort normal and breath sounds normal. No wheezing, rales or rhonchi. Abdominal: Soft, nondistended, nontender. Bowel sounds active throughout. There are no masses palpable. No hepatomegaly. Rectal:  Inflamed internal hemorrhoid on anoscopy Extremities: no edema Lymphadenopathy: No cervical adenopathy noted. Neurological: Alert and oriented to person place and time. Skin: Skin is warm and dry. No rashes noted. Psychiatric: Normal mood and affect. Behavior is normal.   ASSESSMENT AND PLAN:  69.  63 year old female with acute diarrhea with scant blood. Hemoglobin normal yesterday. Suspect infectious process. Bleeding could be hemorrhoidal. I recommended stool studies but patient lives in [facility] and it would be difficult for her to get the samples back. Hopefully this will be a self-limiting illness. Patient will call Friday morning with  a condition update. If no improvement then will treat empirically with a course of antibiotics. Recommended she avoid dairy products and high fiber foods for the time being.  Further recommendations pending clinical course antibiotics.  2. Internal hemorrhoids. Trial of steroid suppositories.

## 2013-09-29 NOTE — Progress Notes (Signed)
Agree with Ms. Guenther's assessment and plan. Carl E. Gessner, MD, FACG   

## 2013-09-30 ENCOUNTER — Telehealth: Payer: Self-pay | Admitting: *Deleted

## 2013-09-30 NOTE — Telephone Encounter (Signed)
Okay. No need for antibiotics at this point, probably infectious diarrhea. Thanks for update.  Gunnar FusiPaula

## 2013-09-30 NOTE — Telephone Encounter (Signed)
Patient called and left a message for Mandy ClusterPaula Guenther, NP. States she is calling with update. She is doing good. No bleeding at this time. She has not had a bowel movement yet.

## 2013-11-30 ENCOUNTER — Other Ambulatory Visit: Payer: Self-pay | Admitting: Internal Medicine

## 2013-11-30 DIAGNOSIS — R1011 Right upper quadrant pain: Secondary | ICD-10-CM

## 2013-12-09 ENCOUNTER — Ambulatory Visit
Admission: RE | Admit: 2013-12-09 | Discharge: 2013-12-09 | Disposition: A | Discharge status: Home / Self Care | Payer: BC Managed Care – PPO | Source: Ambulatory Visit | Attending: Internal Medicine | Admitting: Internal Medicine

## 2013-12-09 DIAGNOSIS — R1011 Right upper quadrant pain: Secondary | ICD-10-CM

## 2013-12-13 ENCOUNTER — Other Ambulatory Visit (HOSPITAL_COMMUNITY): Payer: Self-pay | Admitting: Internal Medicine

## 2013-12-13 DIAGNOSIS — R1011 Right upper quadrant pain: Secondary | ICD-10-CM

## 2013-12-22 ENCOUNTER — Other Ambulatory Visit: Payer: Self-pay

## 2013-12-22 DIAGNOSIS — Z9889 Other specified postprocedural states: Secondary | ICD-10-CM

## 2013-12-22 DIAGNOSIS — Z1231 Encounter for screening mammogram for malignant neoplasm of breast: Secondary | ICD-10-CM

## 2013-12-27 ENCOUNTER — Ambulatory Visit (HOSPITAL_COMMUNITY)
Admission: RE | Admit: 2013-12-27 | Discharge: 2013-12-27 | Disposition: A | Discharge status: Home / Self Care | Payer: BC Managed Care – PPO | Source: Ambulatory Visit | Attending: Internal Medicine | Admitting: Internal Medicine

## 2013-12-27 DIAGNOSIS — R1011 Right upper quadrant pain: Secondary | ICD-10-CM | POA: Diagnosis present

## 2013-12-27 MED ORDER — TECHNETIUM TC 99M MEBROFENIN IV KIT
5.0000 | PACK | Freq: Once | INTRAVENOUS | Status: AC | PRN
  Administered 2013-12-27: 5 via INTRAVENOUS

## 2013-12-27 MED ORDER — SINCALIDE 5 MCG IJ SOLR
0.0200 ug/kg | Freq: Once | INTRAMUSCULAR | Status: AC
  Administered 2013-12-27: 1.19 ug via INTRAVENOUS

## 2013-12-27 MED ORDER — SINCALIDE 5 MCG IJ SOLR
INTRAMUSCULAR | Status: AC
  Administered 2013-12-27: 1.19 ug via INTRAVENOUS
  Filled 2013-12-27: qty 5

## 2013-12-27 MED ORDER — STERILE WATER FOR INJECTION IJ SOLN
INTRAMUSCULAR | Status: AC
  Filled 2013-12-27: qty 10

## 2014-03-09 ENCOUNTER — Ambulatory Visit
Admission: RE | Admit: 2014-03-09 | Discharge: 2014-03-09 | Disposition: A | Discharge status: Home / Self Care | Payer: BC Managed Care – PPO | Source: Ambulatory Visit

## 2014-03-09 DIAGNOSIS — Z9889 Other specified postprocedural states: Secondary | ICD-10-CM

## 2014-03-09 DIAGNOSIS — Z1231 Encounter for screening mammogram for malignant neoplasm of breast: Secondary | ICD-10-CM

## 2014-04-28 ENCOUNTER — Ambulatory Visit
Admission: RE | Admit: 2014-04-28 | Discharge: 2014-04-28 | Disposition: A | Discharge status: Home / Self Care | Payer: BC Managed Care – PPO | Source: Ambulatory Visit | Attending: Nurse Practitioner | Admitting: Nurse Practitioner

## 2014-04-28 ENCOUNTER — Other Ambulatory Visit: Payer: Self-pay | Admitting: Nurse Practitioner

## 2014-04-28 DIAGNOSIS — R0781 Pleurodynia: Secondary | ICD-10-CM

## 2015-03-15 ENCOUNTER — Other Ambulatory Visit: Payer: Self-pay

## 2015-03-15 DIAGNOSIS — Z1231 Encounter for screening mammogram for malignant neoplasm of breast: Secondary | ICD-10-CM

## 2015-03-20 ENCOUNTER — Ambulatory Visit
Admission: EM | Admit: 2015-03-20 | Discharge: 2015-03-20 | Disposition: A | Discharge status: Home / Self Care | Payer: BC Managed Care – PPO | Attending: Family Medicine | Admitting: Family Medicine

## 2015-03-20 ENCOUNTER — Ambulatory Visit (INDEPENDENT_AMBULATORY_CARE_PROVIDER_SITE_OTHER): Payer: BC Managed Care – PPO

## 2015-03-20 DIAGNOSIS — S61411A Laceration without foreign body of right hand, initial encounter: Secondary | ICD-10-CM

## 2015-03-20 DIAGNOSIS — S61451A Open bite of right hand, initial encounter: Secondary | ICD-10-CM

## 2015-03-20 DIAGNOSIS — W540XXA Bitten by dog, initial encounter: Principal | ICD-10-CM

## 2015-03-20 MED ORDER — AMOXICILLIN-POT CLAVULANATE 875-125 MG PO TABS: 1.0000 | ORAL_TABLET | Freq: Two times a day (BID) | ORAL | Status: DC

## 2015-03-20 MED ORDER — TETANUS-DIPHTH-ACELL PERTUSSIS 5-2.5-18.5 LF-MCG/0.5 IM SUSP
0.5000 mL | Freq: Once | INTRAMUSCULAR | Status: AC
  Administered 2015-03-20: 0.5 mL via INTRAMUSCULAR

## 2015-03-20 MED ORDER — TRAMADOL HCL 50 MG PO TABS: 50.0000 mg | ORAL_TABLET | Freq: Three times a day (TID) | ORAL | Status: DC | PRN

## 2015-03-20 NOTE — ED Notes (Signed)
Pt states she was attempting to give her dog medication and he bit her right hand.Marland Kitchen

## 2015-03-20 NOTE — Discharge Instructions (Signed)
Take medication as prescribed. Keep clean. Clean daily with soap and water, rinse, pat dry and apply thin layer topical antibiotic ointment as discussed. Elevate.   Follow up with your primary care physician this week as needed. Return to Urgent care or go to ER for increased swelling, redness, drainage, pain, numbness, new or worsening concerns.   Animal Bite Animal bites can range from mild to serious. An animal bite can result in a scratch on the skin, a deep open cut, a puncture of the skin, a crush injury, or tearing away of the skin or a body part. A small bite from a house pet will usually not cause serious problems. However, some animal bites can become infected or injure a bone or other tissue.  Bites from certain animals can be more dangerous because of the risk of spreading rabies, which is a serious viral infection. This risk is higher with bites from stray animals or wild animals, such as raccoons, foxes, skunks, and bats. Dogs are responsible for most animal bites. Children are bitten more often than adults. SYMPTOMS  Common symptoms of an animal bite include:   Pain.   Bleeding.   Swelling.   Bruising.  DIAGNOSIS  This condition may be diagnosed based on a physical exam and medical history. Your health care provider will examine the wound and ask for details about the animal and how the bite happened. You may also have tests, such as:   Blood tests to check for infection or to determine if surgery is needed.  X-rays to check for damage to bones or joints.  Culture test. This uses a sample of fluid from the wound to check for infection. TREATMENT  Treatment varies depending on the location and type of animal bite and your medical history. Treatment may include:   Wound care. This often includes cleaning the wound, flushing the wound with saline solution, and applying a bandage (dressing). Sometimes, the wound is left open to heal because of the high risk of infection.  However, in some cases, the wound may be closed with stitches (sutures), staples, skin glue, or adhesive strips.   Antibiotic medicine.   Tetanus shot.   Rabies treatment if the animal could have rabies.  In some cases, bites that have become infected may require IV antibiotics and surgical treatment in the hospital.  HOME CARE INSTRUCTIONS Wound Care  Follow instructions from your health care provider about how to take care of your wound. Make sure you:  Wash your hands with soap and water before you change your dressing. If soap and water are not available, use hand sanitizer.  Change your dressing as told by your health care provider.  Leave sutures, skin glue, or adhesive strips in place. These skin closures may need to be in place for 2 weeks or longer. If adhesive strip edges start to loosen and curl up, you may trim the loose edges. Do not remove adhesive strips completely unless your health care provider tells you to do that.  Check your wound every day for signs of infection. Watch for:   Increasing redness, swelling, or pain.   Fluid, blood, or pus.  General Instructions  Take or apply over-the-counter and prescription medicines only as told by your health care provider.   If you were prescribed an antibiotic, take or apply it as told by your health care provider. Do not stop using the antibiotic even if your condition improves.   Keep the injured area raised (elevated) above the  level of your heart while you are sitting or lying down, if this is possible.   If directed, apply ice to the injured area.   Put ice in a plastic bag.   Place a towel between your skin and the bag.   Leave the ice on for 20 minutes, 2-3 times per day.   Keep all follow-up visits as told by your health care provider. This is important.  SEEK MEDICAL CARE IF:  You have increasing redness, swelling, or pain at the site of your wound.   You have a general feeling of  sickness (malaise).   You feel nauseous or you vomit.   You have pain that does not get better.  SEEK IMMEDIATE MEDICAL CARE IF:  You have a red streak extending away from your wound.   You have fluid, blood, or pus coming from your wound.   You have a fever or chills.   You have trouble moving your injured area.   You have numbness or tingling extending beyond the wound.   This information is not intended to replace advice given to you by your health care provider. Make sure you discuss any questions you have with your health care provider.   Document Released: 11/05/2010 Document Revised: 11/08/2014 Document Reviewed: 07/05/2014 Elsevier Interactive Patient Education 2016 [employer] Inc[employer]  Laceration Care, Adult A laceration is a cut that goes through all of the layers of the skin and into the tissue that is right under the skin. Some lacerations heal on their own. Others need to be closed with stitches (sutures), staples, skin adhesive strips, or skin glue. Proper laceration care minimizes the risk of infection and helps the laceration to heal better. HOW TO CARE FOR YOUR LACERATION If sutures or staples were used:  Keep the wound clean and dry.  If you were given a bandage (dressing), you should change it at least one time per day or as told by your health care provider. You should also change it if it becomes wet or dirty.  Keep the wound completely dry for the first 24 hours or as told by your health care provider. After that time, you may shower or bathe. However, make sure that the wound is not soaked in water until after the sutures or staples have been removed.  Clean the wound one time each day or as told by your health care provider:  Wash the wound with soap and water.  Rinse the wound with water to remove all soap.  Pat the wound dry with a clean towel. Do not rub the wound.  After cleaning the wound, apply a thin layer of antibiotic ointmentas told by  your health care provider. This will help to prevent infection and keep the dressing from sticking to the wound.  Have the sutures or staples removed as told by your health care provider. If skin adhesive strips were used:  Keep the wound clean and dry.  If you were given a bandage (dressing), you should change it at least one time per day or as told by your health care provider. You should also change it if it becomes dirty or wet.  Do not get the skin adhesive strips wet. You may shower or bathe, but be careful to keep the wound dry.  If the wound gets wet, pat it dry with a clean towel. Do not rub the wound.  Skin adhesive strips fall off on their own. You may trim the strips as the wound heals. Do  not remove skin adhesive strips that are still stuck to the wound. They will fall off in time. If skin glue was used:  Try to keep the wound dry, but you may briefly wet it in the shower or bath. Do not soak the wound in water, such as by swimming.  After you have showered or bathed, gently pat the wound dry with a clean towel. Do not rub the wound.  Do not do any activities that will make you sweat heavily until the skin glue has fallen off on its own.  Do not apply liquid, cream, or ointment medicine to the wound while the skin glue is in place. Using those may loosen the film before the wound has healed.  If you were given a bandage (dressing), you should change it at least one time per day or as told by your health care provider. You should also change it if it becomes dirty or wet.  If a dressing is placed over the wound, be careful not to apply tape directly over the skin glue. Doing that may cause the glue to be pulled off before the wound has healed.  Do not pick at the glue. The skin glue usually remains in place for 5-10 days, then it falls off of the skin. General Instructions  Take over-the-counter and prescription medicines only as told by your health care provider.  If you  were prescribed an antibiotic medicine or ointment, take or apply it as told by your doctor. Do not stop using it even if your condition improves.  To help prevent scarring, make sure to cover your wound with sunscreen whenever you are outside after stitches are removed, after adhesive strips are removed, or when glue remains in place and the wound is healed. Make sure to wear a sunscreen of at least 30 SPF.  Do not scratch or pick at the wound.  Keep all follow-up visits as told by your health care provider. This is important.  Check your wound every day for signs of infection. Watch for:  Redness, swelling, or pain.  Fluid, blood, or pus.  Raise (elevate) the injured area above the level of your heart while you are sitting or lying down, if possible. SEEK MEDICAL CARE IF:  You received a tetanus shot and you have swelling, severe pain, redness, or bleeding at the injection site.  You have a fever.  A wound that was closed breaks open.  You notice a bad smell coming from your wound or your dressing.  You notice something coming out of the wound, such as wood or glass.  Your pain is not controlled with medicine.  You have increased redness, swelling, or pain at the site of your wound.  You have fluid, blood, or pus coming from your wound.  You notice a change in the color of your skin near your wound.  You need to change the dressing frequently due to fluid, blood, or pus draining from the wound.  You develop a new rash.  You develop numbness around the wound. SEEK IMMEDIATE MEDICAL CARE IF:  You develop severe swelling around the wound.  Your pain suddenly increases and is severe.  You develop painful lumps near the wound or on skin that is anywhere on your body.  You have a red streak going away from your wound.  The wound is on your hand or foot and you cannot properly move a finger or toe.  The wound is on your hand or foot and  you notice that your fingers or  toes look pale or bluish.   This information is not intended to replace advice given to you by your health care provider. Make sure you discuss any questions you have with your health care provider.   Document Released: 02/17/2005 Document Revised: 07/04/2014 Document Reviewed: 02/13/2014 Elsevier Interactive Patient Education 2016 Else[employer] Inc.  Nonsutured Laceration Care A laceration is a cut that goes through all layers of the skin and extends into the tissue that is right under the skin. This type of cut is usually stitched up (sutured) or closed with tape (adhesive strips) or skin glue shortly after the injury happens. However, if the wound is dirty or if several hours pass before medical treatment is provided, it is likely that germs (bacteria) will enter the wound. Closing a laceration after bacteria have entered it increases the risk of infection. In these cases, your health care provider may leave the laceration open (nonsutured) and cover it with a bandage. This type of treatment helps prevent infection and allows the wound to heal from the deepest layer of tissue damage up to the surface. An open fracture is a type of injury that may involve nonsutured lacerations. An open fracture is a break in a bone that happens along with one or more lacerations through the skin that is near the fracture site. HOW TO CARE FOR YOUR NONSUTURED LACERATION  Take or apply over-the-counter and prescription medicines only as told by your health care provider.  If you were prescribed an antibiotic medicine, take or apply it as told by your health care provider. Do not stop using the antibiotic even if your condition improves.  Clean the wound one time each day or as told by your health care provider.  Wash the wound with mild soap and water.  Rinse the wound with water to remove all soap.  Pat your wound dry with a clean towel. Do not rub the wound.  Do not inject anything into the wound unless your  health care provider told you to.  Change any bandages (dressings) as told by your health care provider. This includes changing the dressing if it gets wet, dirty, or starts to smell bad.  Keep the dressing dry until your health care provider says it can be removed. Do not take baths, swim, or do anything that puts your wound underwater until your health care provider approves.  Raise (elevate) the injured area above the level of your heart while you are sitting or lying down, if possible.  Do not scratch or pick at the wound.  Check your wound every day for signs of infection. Watch for:  Redness, swelling, or pain.  Fluid, blood, or pus.  Keep all follow-up visits as told by your health care provider. This is important. SEEK MEDICAL CARE IF:  You received a tetanus and shot and you have swelling, severe pain, redness, or bleeding at the injection site.   You have a fever.  Your pain is not controlled with medicine.  You have increased redness, swelling, or pain at the site of your wound.  You have fluid, blood, or pus coming from your wound.  You notice a bad smell coming from your wound or your dressing.  You notice something coming out of the wound, such as wood or glass.  You notice a change in the color of your skin near your wound.  You develop a new rash.  You need to change the dressing frequently due to  fluid, blood, or pus draining from the wound.  You develop numbness around your wound. SEEK IMMEDIATE MEDICAL CARE IF:  Your pain suddenly increases and is severe.  You develop severe swelling around the wound.  The wound is on your hand or foot and you cannot properly move a finger or toe.  The wound is on your hand or foot and you notice that your fingers or toes look pale or bluish.  You have a red streak going away from your wound.   This information is not intended to replace advice given to you by your health care provider. Make sure you discuss any  questions you have with your health care provider.   Document Released: 01/15/2006 Document Revised: 07/04/2014 Document Reviewed: 02/13/2014 Elsevier Interactive Patient Education [employer]

## 2015-03-20 NOTE — ED Provider Notes (Signed)
[facility]  ____________________________________________  Time seen: Approximately 1:22 PM  I have reviewed the triage vital signs and the nursing notes.   HISTORY  Chief Complaint Animal Bite   HPI Mandy Guzman is a 65 y.o. female presents with complaints of right hand pain and laceration post dogbite. Patient reports that this morning she was giving her dog, which is about a 40 pound basset hound, his medicine. Patient reports that for her to give him his medicine she has to manually open his mouth and then put the pill in his mouth. Patient states that when he she putting the pill in his mouth he bit down on her hand and she pulled her hand out causing laceration. Patient reports this occurred at approximately 8:30 this morning.  Patient reports that laceration area is mild to moderate tenderness to touch at 4 out of 10. Denies pain radiation. Denies numbness or tingling sensation. Denies difficulty grabbing items or decreased range of motion.  Denies fall or head injury. Denies other pain or injury. Reports unsure of last tetanus immunization. Reports her dog is up-to-date on immunizations, including rabies immunization. Patient reports she is right-hand dominant.  PCP [facility]    Past Medical History  Diagnosis Date  . Vertigo   Osteoarthritis   Patient Active Problem List   Diagnosis Date Noted  . Diarrhea 09/29/2013  . Internal hemorrhoids 09/29/2013  . GERD 03/02/2010  . OSTEOPENIA 03/02/2010    Past Surgical History  Procedure Laterality Date  . Abdominal hysterectomy  2001  . Breast surgery  2005    breast reduction    Current Outpatient Rx  Name  Route  Sig  Dispense  Refill  . calcium carbonate (OS-CAL) 600 MG TABS tablet   Oral   Take 600 mg by mouth daily with breakfast.         . Cholecalciferol (VITAMIN D) 1000 UNITS capsule   Oral   Take 1,000 Units by mouth daily.         . Cyanocobalamin (VITAMIN B 12 PO)   Oral  Take by mouth 2 (two) times a week.         . hydrocortisone (ANUSOL-HC) 25 MG suppository      Use 1 suppository at bedtime for 7 days.   7 suppository   1   . loperamide (IMODIUM) 2 MG capsule   Oral   Take 1 capsule (2 mg total) by mouth 4 (four) times daily as needed for diarrhea or loose stools.   12 capsule   0   . Multiple Vitamins-Minerals (MULTIVITAMIN WITH MINERALS) tablet   Oral   Take 1 tablet by mouth daily. One a Day 50 plus         . omeprazole (PRILOSEC) 20 MG capsule   Oral   Take 20 mg by mouth daily as needed.           Allergies Erythromycin base  Family History  Problem Relation Age of Onset  . Heart failure Mother     Social History Social History  Substance Use Topics  . Smoking status: Never Smoker   . Smokeless tobacco: None  . Alcohol Use: No    Review of Systems Constitutional: No fever/chills Eyes: No visual changes. ENT: No sore throat. Cardiovascular: Denies chest pain. Respiratory: Denies shortness of breath. Gastrointestinal: No abdominal pain.  No nausea, no vomiting.  No diarrhea.  No constipation. Genitourinary: Negative for dysuria. Musculoskeletal: Negative for back pain. Skin: Negative for rash.  right hand laceration and pain.  Neurological: Negative for headaches, focal weakness or numbness.  10-point ROS otherwise negative.  ____________________________________________   PHYSICAL EXAM:  VITAL SIGNS: ED Triage Vitals  Enc Vitals Group     BP 03/20/15 1241 143/86 mmHg     Pulse Rate 03/20/15 1241 84     Resp 03/20/15 1241 18     Temp 03/20/15 1241 97.9 F (36.6 C)     Temp Source 03/20/15 1241 Oral     SpO2 03/20/15 1241 100 %     Weight 03/20/15 1241 111 lb (50.349 kg)     Height 03/20/15 1241  (1.499 m)     Head Cir --      Peak Flow --      Pain Score 03/20/15 1243 4     Pain Loc --      Pain Edu? --      Excl. in GC? --     Constitutional: Alert and oriented. Well appearing and in no  acute distress. Eyes: Conjunctivae are normal. PERRL. EOMI. Head: Atraumatic.  Nose: No congestion/rhinnorhea.  Mouth/Throat: Mucous membranes are moist.  Oropharynx non-erythematous. Cardiovascular: Normal rate, regular rhythm. Grossly normal heart sounds.  Good peripheral circulation. Respiratory: Normal respiratory effort.  No retractions. Lungs CTAB. Gastrointestinal: Soft and nontender.  Musculoskeletal: No lower or upper extremity tenderness nor edema.  No cervical, thoracic or lumbar tenderness to palpation. Bilateral hand grips equal. Bilateral distal radial pulses equal and easily palpable. Except: Right dorsal hand across first and second mid to distal metacarpals, mild to moderate tenderness to palpation with mild to moderate swelling, 2 superficial <0.5 cm puncture marks noted, with a 2.5 cm flap laceration present superficial, no exudate or drainage. No surrounding erythema. NO motor, sensation or tendon deficit to right hand. Cap refill < 2 seconds to right distal fingers.  Neurologic :  Normal speech and language. No gross focal neurologic deficits are appreciated. No gait instability. Skin:  Skin is warm, dry and intact. No rash noted. Psychiatric: Mood and affect are normal. Speech and behavior are normal.  ____________________________________________   LABS (all labs ordered are listed, but only abnormal results are displayed)  Labs Reviewed - No data to display  RADIOLOGY  RIGHT HAND - COMPLETE 3+ VIEW  COMPARISON: 10/19/2009 right hand radiograph.  FINDINGS: There is dorsal right hand soft tissue swelling with associated extensive subcutaneous emphysema. No fracture, dislocation or suspicious focal osseous lesion. There is polyarticular erosive osteoarthritis throughout the interphalangeal joints of the right hand, severely involving the interphalangeal joint of the right common, DIP joints of the second through fourth fingers and PIP joint of the right fifth  finger, with interval progression since 10/19/2009. There is new ankylosis of the right fourth DIP joint. Diffuse osteopenia.  IMPRESSION: 1. Dorsal right hand soft tissue swelling with associated extensive subcutaneous emphysema, a nonspecific finding for which a gas producing cellulitis including necrotizing fasciitis cannot be excluded. 2. No fracture or dislocation. 3. Severe progressive polyarticular erosive osteoarthritis in the interphalangeal joints of the right hand as described.   Electronically Signed By: Delbert Phenix M.D. On: 03/20/2015 13:59  I, Renford Dills, personally viewed and evaluated these images (plain radiographs) as part of my medical decision making, as well as reviewing the written report by the radiologist.  ____________________________________________   PROCEDURES  Procedure(s) performed:  Procedure(s) performed:  Procedure explained and verbal consent obtained. Consent: Verbal consent obtained. Written consent not obtained. Risks and benefits: risks, benefits and alternatives  were discussed Patient identity confirmed: verbally with patient and hospital-assigned identification number  Consent given by: patient   Laceration Repair Location: Right dorsal hand Length: 2.5 cm Foreign bodies: no foreign bodies Tendon involvement: none Nerve involvement: none Preparation: Patient was prepped and draped in the usual sterile fashion. Anesthesia no anesthesia:  Irrigation solution: saline and Betadine  Irrigation method: jet lavage Amount of cleaning: copious Suture repair indicated. Repaired with 2 Steri-Strips Approximation: loose Patient tolerate well. Wound well approximated post repair.  Antibiotic ointment and dressing applied.  Wound care instructions provided.  Observe for any signs of infection or other problems.     ____________________________________________   INITIAL IMPRESSION / ASSESSMENT AND PLAN / ED COURSE  Pertinent  labs & imaging results that were available during my care of the patient were reviewed by me and considered in my medical decision making (see chart for details).  Very well-appearing patient in no acute distress. Presents for the complaint of right dorsal hand pain and laceration post dog bite. Reports this is her dog. Reports that dog is up-to-date on immunizations including rabies shot. Patient immunization status updated. Animal control notified by RN. Right dorsal hand 2.5 cm flap laceration with surrounding 2 superficial puncture marks. Wound cleaned and irrigated as well as symptom Betadine. Right hand x-ray revealing dorsal right hand soft tissue swelling with associated extensive subcutaneous emphysema, no fracture or dislocation, severe progressive polyarticular erosive osteoarthritis, no radiopaque foreign bodies per radiology.   Wound cleaned and irrigated. Laceration flap which is adhering well to skin, no suturing indicated. Wound repaired with 2 Steri-Strips, antibiotic and dressing applied by myself. Will treat patient with oral Augmentin, when necessary ibuprofen and tramadol.  elevate apply ice and monitor closely. Directed in wound cleaning daily. Follow with PCP for wound check this week.  Discussed follow up with Primary care physician this week. Discussed follow up and return to urgent care or ER parameters including increased pain, increased swelling, drainage, redness, fevers, numbness, tingling sensation, no resolution or any worsening concerns. Patient verbalized understanding and agreed to plan.   ____________________________________________   FINAL CLINICAL IMPRESSION(S) / ED DIAGNOSES  Final diagnoses:  Dog bite, hand, right, initial encounter  Hand laceration, right, initial encounter        Renford Dills, NP 03/20/15 1547

## 2015-03-24 ENCOUNTER — Encounter (HOSPITAL_COMMUNITY): Payer: Self-pay | Admitting: Emergency Medicine

## 2015-03-24 ENCOUNTER — Emergency Department (HOSPITAL_COMMUNITY)
Admission: EM | Admit: 2015-03-24 | Discharge: 2015-03-24 | Disposition: A | Discharge status: Home / Self Care | Payer: BC Managed Care – PPO | Source: Home / Self Care | Attending: Emergency Medicine | Admitting: Emergency Medicine

## 2015-03-24 DIAGNOSIS — Z5189 Encounter for other specified aftercare: Secondary | ICD-10-CM | POA: Diagnosis not present

## 2015-03-24 NOTE — Discharge Instructions (Signed)
The wound is healing well. You do not have to replace the Steri-Strips if they fall off. Wash it with soap and water twice a day. You can leave it open, but if you are going to be doing stuff, cover it with a bandage to protect it. Make sure you finish all of your antibiotic. Follow up as needed.

## 2015-03-24 NOTE — ED Notes (Signed)
The patient presented to the [facility] with a complaint of a wound check. The patient stated that she was evaluated at the [facility] on 03/20/2015 for an animal bite and was treated. She presents today to have the wound checked as some of the steri-strips have come off.

## 2015-03-24 NOTE — ED Provider Notes (Signed)
CSN: [id number]     Arrival date & time 03/24/15  1833 History   First MD Initiated Contact with Patient 03/24/15 1845     Chief Complaint  Patient presents with  . Wound Check  . Animal Bite   (Consider location/radiation/quality/duration/timing/severity/associated sxs/prior Treatment) HPI  She is a 65 year old woman here for wound check. She was seen at the [facility] urgent care 4 days ago for a dog bite to her right hand. It was her own dog and is up-to-date on rabies vaccinations. The wound was irrigated and loosely approximated with Steri-Strips. She was started on Augmentin. She has been taking the Augmentin as prescribed. She states one of the Steri-Strips fell off today so she washed it and she was concerned she saw an area of pus. She denies any pain. She will occasionally get some tingling in her digits distal to the injury.  Past Medical History  Diagnosis Date  . Vertigo    Past Surgical History  Procedure Laterality Date  . Abdominal hysterectomy  2001  . Breast surgery  2005    breast reduction   Family History  Problem Relation Age of Onset  . Heart failure Mother    Social History  Substance Use Topics  . Smoking status: Never Smoker   . Smokeless tobacco: None  . Alcohol Use: No   OB History    No data available     Review of Systems As in history of present illness Allergies  Erythromycin base  Home Medications   Prior to Admission medications   Medication Sig Start Date End Date Taking? Authorizing Provider  amoxicillin-clavulanate (AUGMENTIN) 875-125 MG tablet Take 1 tablet by mouth every 12 (twelve) hours. 03/20/15   Renford Dills, NP  calcium carbonate (OS-CAL) 600 MG TABS tablet Take 600 mg by mouth daily with breakfast.    Historical Provider, MD  Cholecalciferol (VITAMIN D) 1000 UNITS capsule Take 1,000 Units by mouth daily.    Historical Provider, MD  Cyanocobalamin (VITAMIN B 12 PO) Take by mouth 2 (two) times a week.    Historical Provider, MD   hydrocortisone (ANUSOL-HC) 25 MG suppository Use 1 suppository at bedtime for 7 days. 09/28/13   Meredith Pel, NP  loperamide (IMODIUM) 2 MG capsule Take 1 capsule (2 mg total) by mouth 4 (four) times daily as needed for diarrhea or loose stools. 09/27/13   Graylon Good, PA-C  Multiple Vitamins-Minerals (MULTIVITAMIN WITH MINERALS) tablet Take 1 tablet by mouth daily. One a Day 50 plus    Historical Provider, MD  omeprazole (PRILOSEC) 20 MG capsule Take 20 mg by mouth daily as needed.    Historical Provider, MD  traMADol (ULTRAM) 50 MG tablet Take 1 tablet (50 mg total) by mouth every 8 (eight) hours as needed (Do not drive or operate machinery while taking as can cause drowsiness.). 03/20/15   Renford Dills, NP   Meds Ordered and Administered this Visit  Medications - No data to display  BP 140/91 mmHg  Pulse 85  Temp(Src) 97.6 F (36.4 C) (Oral)  SpO2 99% No data found.   Physical Exam  Constitutional: She is oriented to person, place, and time. She appears well-developed and well-nourished. No distress.  Cardiovascular: Normal rate.   Pulmonary/Chest: Effort normal.  Neurological: She is alert and oriented to person, place, and time.  Skin:  Flap type laceration to the dorsal right hand. This is healing well. No sign of infection. No drainage.    ED Course  Procedures (  including critical care time)  Labs Review Labs Reviewed - No data to display  Imaging Review No results found.    MDM   1. Visit for wound check    Reassurance provided that the wound is healing well. Discussed if the Steri-Strips fall off again, she does not need to replace them. Wound care as in after visit summary. Bandaged applied prior to discharge.    Charm Rings, MD 03/24/15 Ebony Cargo

## 2015-04-09 ENCOUNTER — Ambulatory Visit
Admission: RE | Admit: 2015-04-09 | Discharge: 2015-04-09 | Disposition: A | Discharge status: Home / Self Care | Payer: BC Managed Care – PPO | Source: Ambulatory Visit

## 2015-04-09 DIAGNOSIS — Z1231 Encounter for screening mammogram for malignant neoplasm of breast: Secondary | ICD-10-CM

## 2015-07-29 IMAGING — US US ABDOMEN COMPLETE
1 series · 14 of 25 positions shown · non-contrast
Comparison: Ultrasound abdomen 03/01/2009

CLINICAL DATA: Right upper quadrant pain

EXAM:
ULTRASOUND ABDOMEN COMPLETE

[Series 1: us abdomen complete · 0.22mm/px · 14 of 83 slices shown]
[im 1/83]
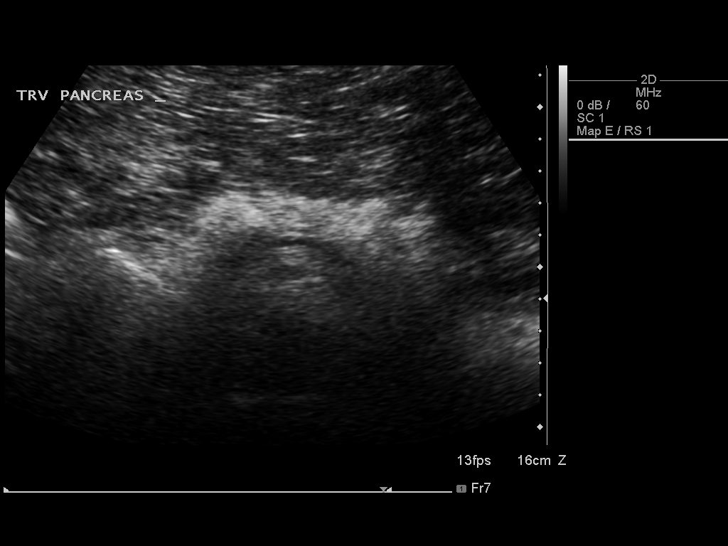
[im 7/83]
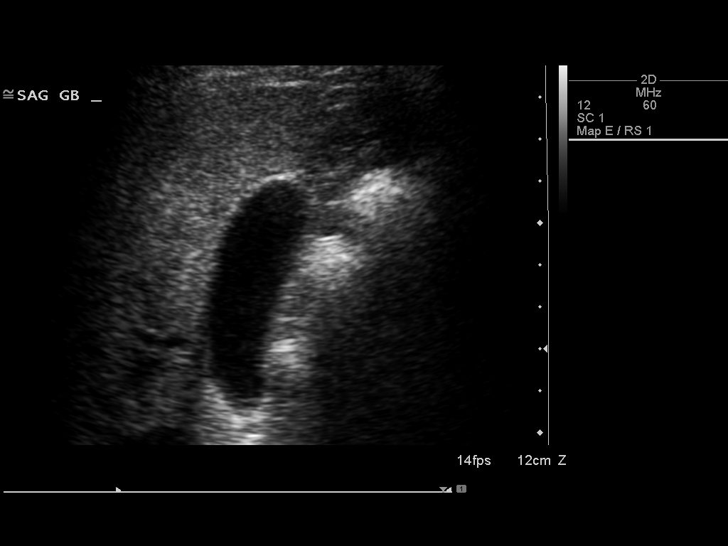
[im 14/83]
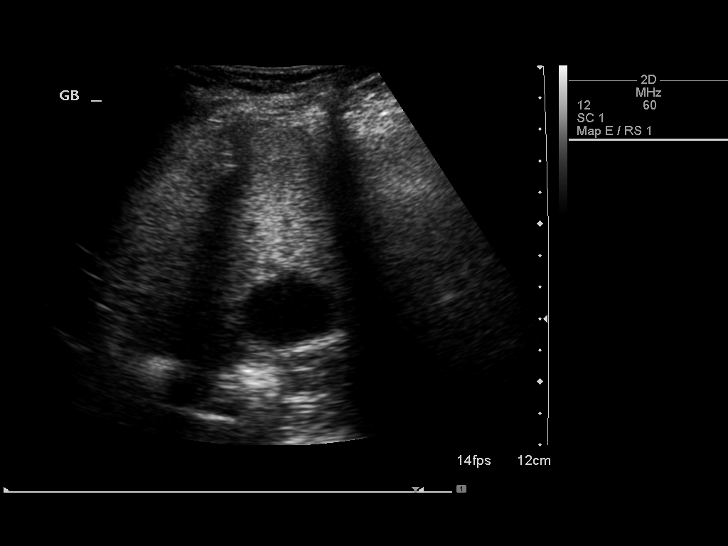
[im 21/83]
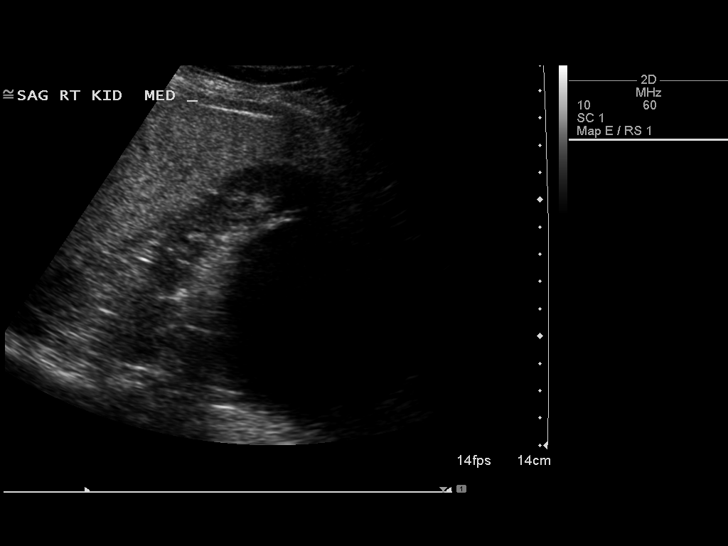
[im 28/83]
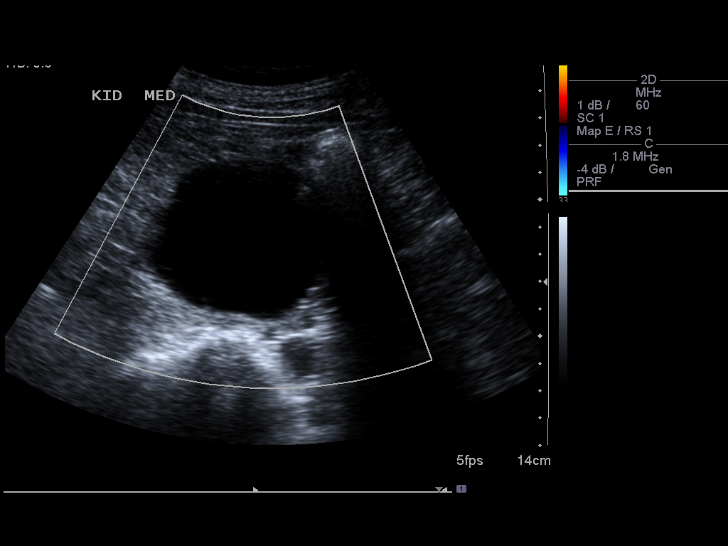
[im 31/83]
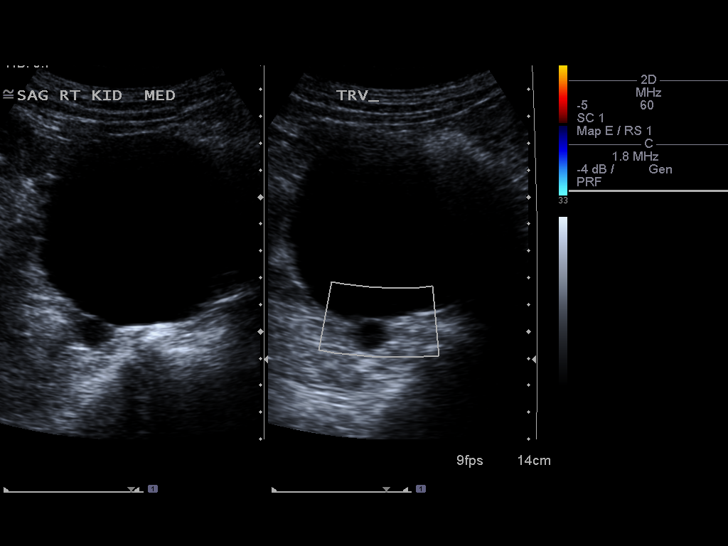
[im 38/83]
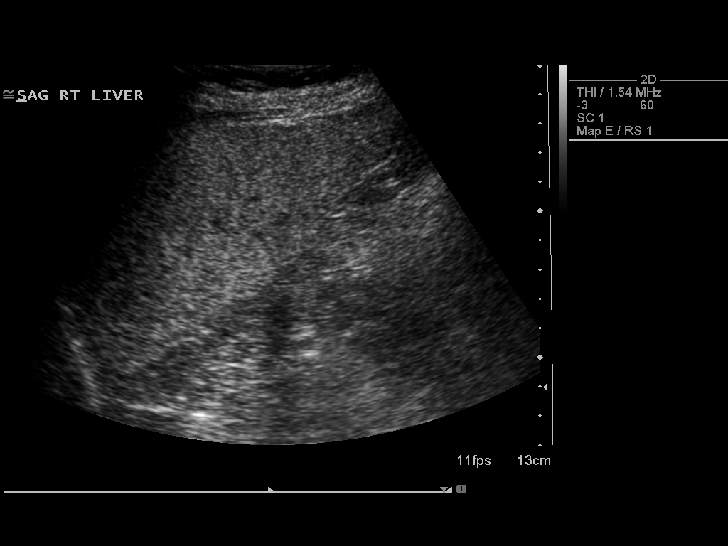
[im 45/83]
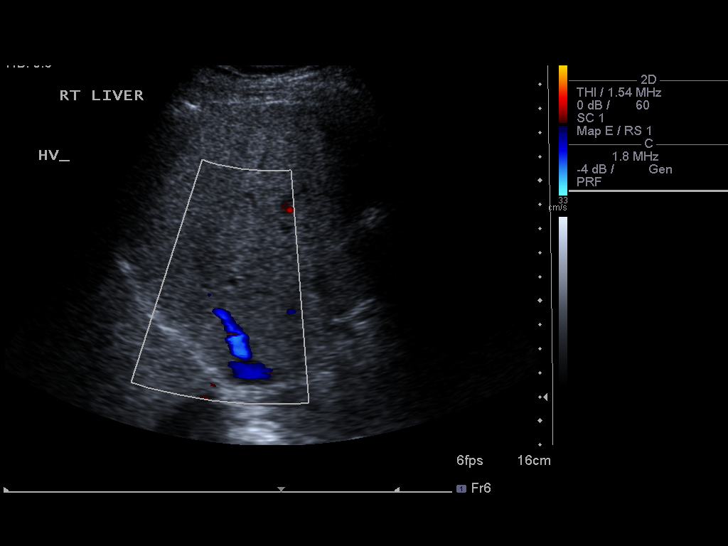
[im 52/83]
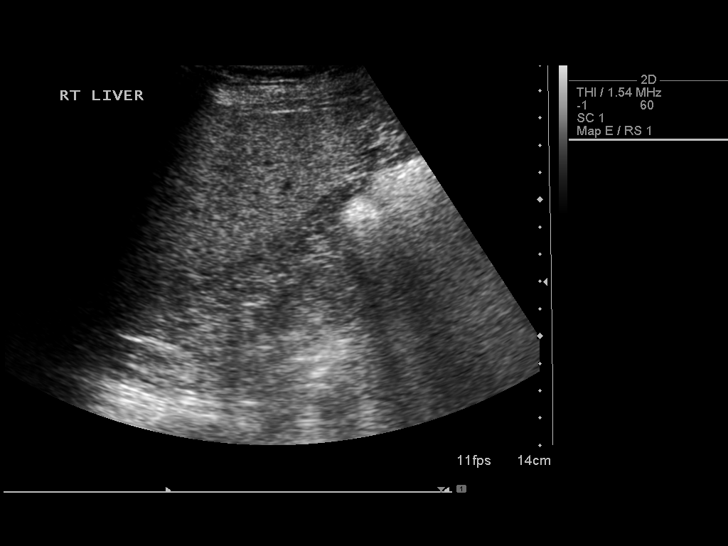
[im 55/83]
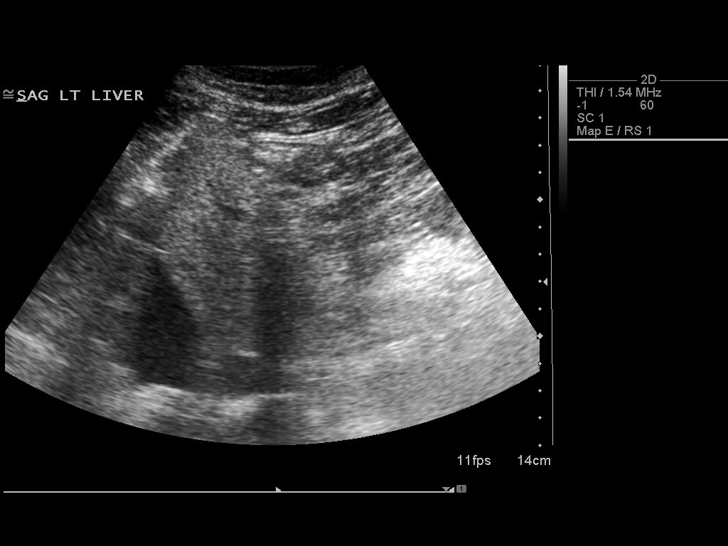
[im 62/83]
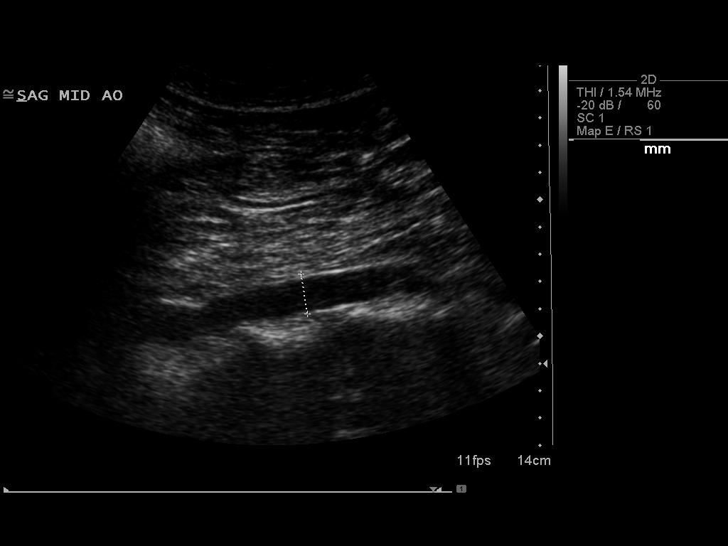
[im 69/83]
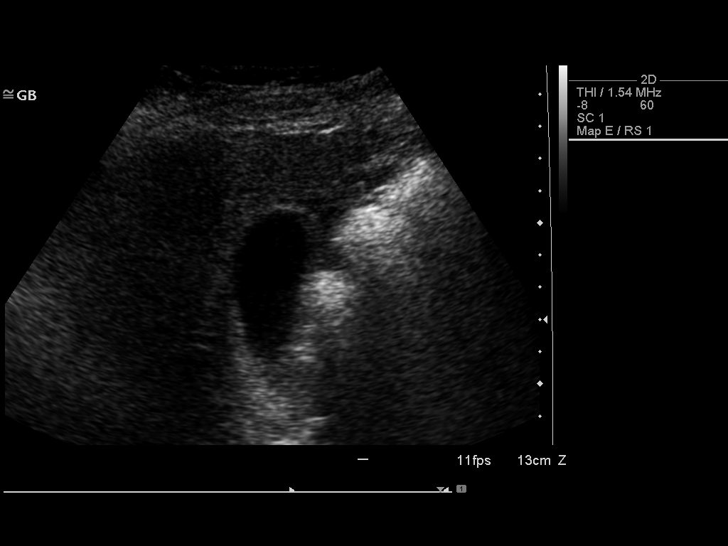
[im 76/83]
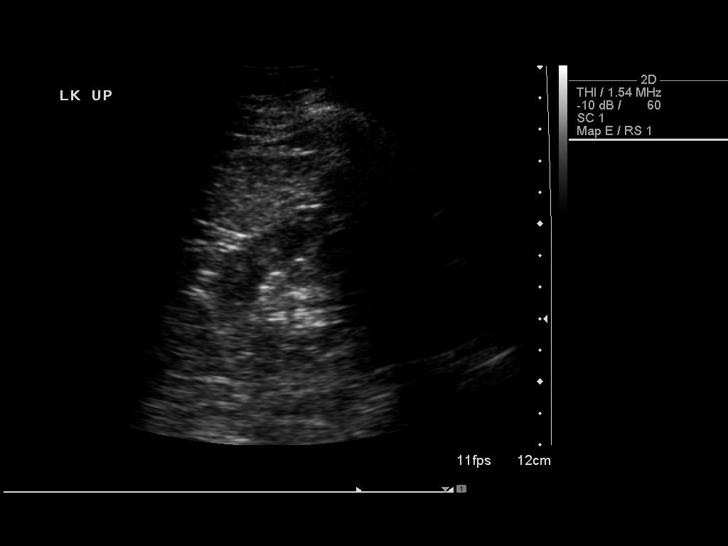
[im 83/83]
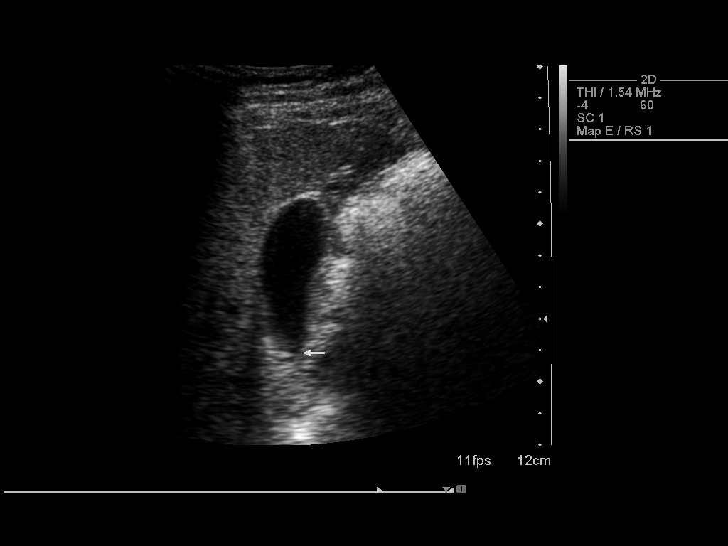

[14 of 25 positions shown; findings below may reference images not displayed]

FINDINGS: Gallbladder: 3.5 mm echogenic focus in the neck. This is non mobile
and could represent stone or polyp.

Common bile duct: Diameter: 3.7 mm

Liver: Hyperechoic liver texture without focal lesion lesion.

IVC: Limited visualization

Pancreas: Limited

Spleen: Size and appearance within normal limits.

Right Kidney: Length: 10.1 cm. Large 8 cm cyst medial to the kidney.
Smaller 16 mm cyst also noted.. Echogenicity within normal limits.
No mass or hydronephrosis visualized.

Left Kidney: Length: 10.7 cm. Echogenicity within normal limits. No
mass or hydronephrosis visualized.

Abdominal aorta: No aneurysm visualized.

Other findings: None.
IMPRESSION: 3.5 mm polyp versus gallstone in the gallbladder neck. Bile ducts
nondilated.

Hyperechoic liver

Large right renal cyst.

## 2016-01-08 ENCOUNTER — Ambulatory Visit: Payer: BC Managed Care – PPO

## 2016-01-08 ENCOUNTER — Ambulatory Visit (INDEPENDENT_AMBULATORY_CARE_PROVIDER_SITE_OTHER): Payer: BC Managed Care – PPO | Admitting: Podiatry

## 2016-01-08 ENCOUNTER — Ambulatory Visit (INDEPENDENT_AMBULATORY_CARE_PROVIDER_SITE_OTHER): Payer: BC Managed Care – PPO

## 2016-01-08 ENCOUNTER — Telehealth: Payer: Self-pay | Admitting: *Deleted

## 2016-01-08 VITALS — BP 113/80 | HR 80 | Temp 98.2°F | Ht 59.0 in | Wt 131.0 lb

## 2016-01-08 DIAGNOSIS — R52 Pain, unspecified: Secondary | ICD-10-CM

## 2016-01-08 DIAGNOSIS — M722 Plantar fascial fibromatosis: Secondary | ICD-10-CM

## 2016-01-08 DIAGNOSIS — M79672 Pain in left foot: Secondary | ICD-10-CM

## 2016-01-08 DIAGNOSIS — M79673 Pain in unspecified foot: Secondary | ICD-10-CM

## 2016-01-08 MED ORDER — NONFORMULARY OR COMPOUNDED ITEM: 1.0000 g | Freq: Four times a day (QID) | 2 refills | Status: DC

## 2016-01-08 MED ORDER — BETAMETHASONE SOD PHOS & ACET 6 (3-3) MG/ML IJ SUSP: 3.0000 mg | Freq: Once | INTRAMUSCULAR | Status: AC

## 2016-01-08 MED ORDER — MELOXICAM 15 MG PO TABS: 15.0000 mg | ORAL_TABLET | Freq: Every day | ORAL | 1 refills | Status: AC

## 2016-01-08 NOTE — Telephone Encounter (Addendum)
Pt states she has not received any information about a cream. Left message informing pt of [facility] order and their information. 01/09/2016-Informed pt, Dr. Logan BoresEvans assistant was processing and [facility] or tomorrow. Pt states understanding.

## 2016-01-08 NOTE — Progress Notes (Signed)
Patient ID: Mandy Guzman, female   DOB: Jul 19, 1950, 65 y.o.   MRN: [id number] Subjective: Patient presents today for pain and tenderness in the left foot. Patient states the foot pain has been hurting for several weeks now. Patient states that it hurts in the mornings with the first steps out of bed. Patient presents today for further treatment and evaluation  Objective: Physical Exam General: The patient is alert and oriented x3 in no acute distress.  Dermatology: Skin is warm, dry and supple bilateral lower extremities. Negative for open lesions or macerations bilateral.   Vascular: Dorsalis Pedis and Posterior Tibial pulses palpable bilateral.  Capillary fill time is immediate to all digits.  Neurological: Epicritic and protective threshold intact bilateral.   Musculoskeletal: Tenderness to palpation at the medial calcaneal tubercale and through the insertion of the plantar fascia of the left foot. All other joints range of motion within normal limits bilateral. Strength 5/5 in all groups bilateral.   Radiographic exam:   Normal osseous mineralization. Joint spaces preserved. No fracture/dislocation/boney destruction. Calcaneal spur present with mild thickening of plantar fascia left. No other soft tissue abnormalities or radiopaque foreign bodies.   Assessment: 1. Plantar fasciitis left foot 2. Pain in left foot  Plan of Care:   1. Patient evaluated. Xrays reviewed.   2. Injection of 0.5cc Celestone soluspan injected into the left plantar fascia.  3. Instructed patient regarding therapies and modalities at home to alleviate symptoms.  4. Rx for meloxicam 15 mg 5. Rx for anti-inflammatory pain cream [facility]  6. Plantar fascial band(s) dispensed. 7. Today the patient was scanned for custom molded orthotics 8. Return to clinic in 4 weeks for orthotic pickup and follow-up evaluation.

## 2016-01-08 NOTE — Telephone Encounter (Signed)
I am in the process of placing orders now

## 2016-01-08 NOTE — Addendum Note (Signed)
Addended by: Renaldo ReelPARRY, MELODY A on: 01/08/2016 05:08 PM   Modules accepted: Orders

## 2016-01-08 NOTE — Progress Notes (Signed)
   Subjective:    Patient ID: Mandy Guzman, female    DOB: [birth year], 65 y.o.   MRN: [id number]  HPI    Review of Systems  Musculoskeletal: Positive for arthralgias.  All other systems reviewed and are negative.      Objective:   Physical Exam        Assessment & Plan:

## 2016-01-29 ENCOUNTER — Ambulatory Visit (INDEPENDENT_AMBULATORY_CARE_PROVIDER_SITE_OTHER): Payer: BC Managed Care – PPO | Admitting: Podiatry

## 2016-01-29 DIAGNOSIS — M79672 Pain in left foot: Secondary | ICD-10-CM

## 2016-01-29 DIAGNOSIS — M722 Plantar fascial fibromatosis: Secondary | ICD-10-CM

## 2016-01-29 NOTE — Patient Instructions (Signed)

## 2016-01-30 NOTE — Progress Notes (Signed)
Subjective: Patient presents today to pick up her orthotics. Patient has no complaints. Patient states that her left foot feels much better after the last injection  Assessment: #1 Plantar fasciitis left foot #2 pain in left foot  Plan of care: #1 orthotics were dispensed today. Instructions for break-in period were provided. #2 return in one month

## 2016-02-04 ENCOUNTER — Encounter: Payer: Self-pay | Admitting: *Deleted

## 2016-02-29 ENCOUNTER — Ambulatory Visit: Payer: BC Managed Care – PPO | Admitting: Podiatry

## 2016-03-24 ENCOUNTER — Other Ambulatory Visit: Payer: Self-pay | Admitting: Obstetrics and Gynecology

## 2016-03-24 DIAGNOSIS — Z1231 Encounter for screening mammogram for malignant neoplasm of breast: Secondary | ICD-10-CM

## 2016-04-18 ENCOUNTER — Ambulatory Visit
Admission: RE | Admit: 2016-04-18 | Discharge: 2016-04-18 | Disposition: A | Discharge status: Home / Self Care | Payer: BC Managed Care – PPO | Source: Ambulatory Visit | Attending: Obstetrics and Gynecology | Admitting: Obstetrics and Gynecology

## 2016-04-18 DIAGNOSIS — Z1231 Encounter for screening mammogram for malignant neoplasm of breast: Secondary | ICD-10-CM

## 2017-03-26 ENCOUNTER — Other Ambulatory Visit: Payer: Self-pay | Admitting: Obstetrics and Gynecology

## 2017-03-26 DIAGNOSIS — Z139 Encounter for screening, unspecified: Secondary | ICD-10-CM

## 2017-04-20 ENCOUNTER — Ambulatory Visit
Admission: RE | Admit: 2017-04-20 | Discharge: 2017-04-20 | Disposition: A | Discharge status: Home / Self Care | Payer: BC Managed Care – PPO | Source: Ambulatory Visit | Attending: Obstetrics and Gynecology | Admitting: Obstetrics and Gynecology

## 2017-04-20 DIAGNOSIS — Z139 Encounter for screening, unspecified: Secondary | ICD-10-CM

## 2017-09-02 DIAGNOSIS — Z79899 Other long term (current) drug therapy: Secondary | ICD-10-CM | POA: Diagnosis not present

## 2017-09-02 DIAGNOSIS — E559 Vitamin D deficiency, unspecified: Secondary | ICD-10-CM | POA: Diagnosis not present

## 2017-09-02 DIAGNOSIS — Z23 Encounter for immunization: Secondary | ICD-10-CM | POA: Diagnosis not present

## 2017-09-02 DIAGNOSIS — E785 Hyperlipidemia, unspecified: Secondary | ICD-10-CM | POA: Diagnosis not present

## 2017-09-02 DIAGNOSIS — Q619 Cystic kidney disease, unspecified: Secondary | ICD-10-CM | POA: Diagnosis not present

## 2017-09-02 DIAGNOSIS — Z0000 Encounter for general adult medical examination without abnormal findings: Secondary | ICD-10-CM | POA: Diagnosis not present

## 2017-09-02 DIAGNOSIS — M1990 Unspecified osteoarthritis, unspecified site: Secondary | ICD-10-CM | POA: Diagnosis not present

## 2017-09-02 DIAGNOSIS — M8580 Other specified disorders of bone density and structure, unspecified site: Secondary | ICD-10-CM | POA: Diagnosis not present

## 2017-09-02 DIAGNOSIS — J309 Allergic rhinitis, unspecified: Secondary | ICD-10-CM | POA: Diagnosis not present

## 2017-09-02 DIAGNOSIS — K219 Gastro-esophageal reflux disease without esophagitis: Secondary | ICD-10-CM | POA: Diagnosis not present

## 2017-09-22 DIAGNOSIS — Z01419 Encounter for gynecological examination (general) (routine) without abnormal findings: Secondary | ICD-10-CM | POA: Diagnosis not present

## 2017-09-22 DIAGNOSIS — N952 Postmenopausal atrophic vaginitis: Secondary | ICD-10-CM | POA: Diagnosis not present

## 2017-09-22 DIAGNOSIS — Z6825 Body mass index (BMI) 25.0-25.9, adult: Secondary | ICD-10-CM | POA: Diagnosis not present

## 2017-10-27 DIAGNOSIS — Z780 Asymptomatic menopausal state: Secondary | ICD-10-CM | POA: Diagnosis not present

## 2018-03-15 DIAGNOSIS — R109 Unspecified abdominal pain: Secondary | ICD-10-CM | POA: Diagnosis not present

## 2018-03-15 DIAGNOSIS — Z1211 Encounter for screening for malignant neoplasm of colon: Secondary | ICD-10-CM | POA: Diagnosis not present

## 2018-03-15 DIAGNOSIS — R194 Change in bowel habit: Secondary | ICD-10-CM | POA: Diagnosis not present

## 2018-04-22 ENCOUNTER — Encounter: Payer: Self-pay | Admitting: Nurse Practitioner

## 2018-04-22 ENCOUNTER — Ambulatory Visit (INDEPENDENT_AMBULATORY_CARE_PROVIDER_SITE_OTHER): Payer: Medicare Other | Admitting: Nurse Practitioner

## 2018-04-22 VITALS — BP 100/80 | HR 80 | Ht 59.0 in | Wt 127.0 lb

## 2018-04-22 DIAGNOSIS — R194 Change in bowel habit: Secondary | ICD-10-CM | POA: Diagnosis not present

## 2018-04-22 DIAGNOSIS — R1033 Periumbilical pain: Secondary | ICD-10-CM

## 2018-04-22 NOTE — Progress Notes (Addendum)
ASSESSMENT / PLAN:   69.  68 year old female with recent onset of periumbilical discomfort associated with bowel changes in the form of softer/smaller caliber stools.  Symptoms resolved after being treated empirically for diverticulitis but in retrospect patient wonders if almond milk may have been the culprit.  After re-challenging almond milk patient began to develop recurrent symptoms.  Since discontinuation of almond milk and resumption of 2% milk she has had no further periumbilical discomfort and her stools are back to being completely normal.  -Hopefully patient has already figured out the etiology of her symptoms.  -She will call for any recurrent symptoms  2.  Colon cancer screening.  Patient is due for 10-year colonoscopy July 2020.   Agree with Ms. 's assessment and plan. Iva Boop, MD, [facility]    HPI:    Chief Complaint: Abdominal pain and bowel changes  Patient is a 68 year old female known remotely to Dr. Leone Payor from colon cancer screening in July 2010.  She is here for evaluation of abdominal pain and bowel changes.  In early January patient developed diffuse periumbilical pain associated with bowel changes described as soft stools, small in caliber.  Mid January patient saw PCP and was treated empirically for diverticulitis.  Her symptoms resolved.  Following that patient wondered if the almond milk she had recently started using in fruit smoothies may have been the actual culprit.  She discontinued almond milk, went back to 2%.  She did rechallenge the almond milk and after just a small amount she began to feel some of the same symptoms.  Patient has totally discontinued almond milk, she has had no recurrent abdominal pain since.  Her bowel movements are back to being completely normal.  Her weight is stable.   Gavin Pound has no other GI complaints.  She does mention occasional RUQ pain but this is chronic/intermittent only related to excessive  consumption of fatty foods.  She had a HIDA scan 12/27/2013 and gallbladder EF was normal.   Data Reviewed:   CBC 03/15/18 -normal   Past Medical History:  Diagnosis Date  . Vertigo      Past Surgical History:  Procedure Laterality Date  . ABDOMINAL HYSTERECTOMY  2001  . BREAST SURGERY  2005   breast reduction  . REDUCTION MAMMAPLASTY Bilateral   . TONSILLECTOMY AND ADENOIDECTOMY     Family History  Problem Relation Age of Onset  . Heart failure Mother    Social History   Tobacco Use  . Smoking status: Never Smoker  . Smokeless tobacco: Never Used  Substance Use Topics  . Alcohol use: No  . Drug use: No   Current Outpatient Medications  Medication Sig Dispense Refill  . Biotin 1000 MCG tablet Take 1,000 mcg by mouth daily.    . calcium carbonate (OS-CAL) 600 MG TABS tablet Take 600 mg by mouth daily with breakfast.    . Cholecalciferol (VITAMIN D) 1000 UNITS capsule Take 1,000 Units by mouth daily.    . Cyanocobalamin (VITAMIN B 12 PO) Take by mouth 2 (two) times a week.    . diphenhydrAMINE (BENADRYL) 25 MG tablet Take 25 mg by mouth every 6 (six) hours as needed.    Marland Kitchen estradiol (ESTRACE) 0.1 MG/GM vaginal cream as directed.    . Magnesium Gluconate 550 MG TABS Take 1 tablet by mouth 2 (two) times daily.    . Multiple Vitamins-Minerals (MULTIVITAMIN WITH MINERALS) tablet  Take 1 tablet by mouth daily. One a Day 50 plus    . omeprazole (PRILOSEC) 20 MG capsule Take 20 mg by mouth daily as needed.     Current Facility-Administered Medications  Medication Dose Route Frequency Provider Last Rate Last Dose  . betamethasone acetate-betamethasone sodium phosphate (CELESTONE) injection 3 mg  3 mg Intramuscular Once Felecia Shelling, DPM       Allergies  Allergen Reactions  . Erythromycin Base     REACTION: up set stomach  . Sulfamethoxazole Nausea And Vomiting     Review of Systems: Positive for allergy/sinus trouble, anxiety, and arthritis.  All other systems reviewed  and negative except where noted in HPI.   Creatinine clearance cannot be calculated (Patient's most recent lab result is older than the maximum 21 days allowed.)   Physical Exam:    Wt Readings from Last 3 Encounters:  04/22/18 127 lb (57.6 kg)  01/08/16 131 lb (59.4 kg)  03/20/15 111 lb (50.3 kg)    BP 100/80   Pulse 80   Ht 4\' 11"  (1.499 m)   Wt 127 lb (57.6 kg)   BMI 25.65 kg/m  Constitutional:  Pleasant female in no acute distress. Psychiatric: Normal mood and affect. Behavior is normal. EENT: Pupils normal.  Conjunctivae are normal. No scleral icterus. Neck supple.  Cardiovascular: Normal rate, regular rhythm. No edema Pulmonary/chest: Effort normal and breath sounds normal. No wheezing, rales or rhonchi. Abdominal: Soft, nondistended, nontender. Bowel sounds active throughout. There are no masses palpable. No hepatomegaly. Neurological: Alert and oriented to person place and time. Skin: Skin is warm and dry. No rashes noted.  Willette Cluster, NP  04/22/2018, 11:56 AM  Cc: Marden Noble, MD

## 2018-04-22 NOTE — Patient Instructions (Addendum)
If you are age 66 or older, your body mass index should be between 23-30. Your Body mass index is 25.65 kg/m. If this is out of the aforementioned range listed, please consider follow up with your Primary Care Provider.  If you are age 73 or younger, your body mass index should be between 19-25. Your Body mass index is 25.65 kg/m. If this is out of the aformentioned range listed, please consider follow up with your Primary Care Provider.   Call if recurrent abdominal pain or bowel changes.  Thank you for choosing me and [facility].   Willette Cluster, NP

## 2018-04-26 ENCOUNTER — Encounter: Payer: Self-pay | Admitting: Nurse Practitioner

## 2018-05-10 ENCOUNTER — Other Ambulatory Visit: Payer: Self-pay | Admitting: Internal Medicine

## 2018-05-10 ENCOUNTER — Ambulatory Visit
Admission: RE | Admit: 2018-05-10 | Discharge: 2018-05-10 | Disposition: A | Discharge status: Home / Self Care | Payer: Medicare Other | Source: Ambulatory Visit | Attending: Internal Medicine | Admitting: Internal Medicine

## 2018-05-10 DIAGNOSIS — M79671 Pain in right foot: Secondary | ICD-10-CM

## 2018-05-10 DIAGNOSIS — S9031XA Contusion of right foot, initial encounter: Secondary | ICD-10-CM | POA: Diagnosis not present

## 2018-05-13 DIAGNOSIS — M79671 Pain in right foot: Secondary | ICD-10-CM | POA: Diagnosis not present

## 2018-05-13 DIAGNOSIS — S8264XA Nondisplaced fracture of lateral malleolus of right fibula, initial encounter for closed fracture: Secondary | ICD-10-CM | POA: Diagnosis not present

## 2018-06-02 DIAGNOSIS — M79671 Pain in right foot: Secondary | ICD-10-CM | POA: Diagnosis not present

## 2018-07-01 DIAGNOSIS — Z889 Allergy status to unspecified drugs, medicaments and biological substances status: Secondary | ICD-10-CM | POA: Diagnosis not present

## 2018-07-01 DIAGNOSIS — K0389 Other specified diseases of hard tissues of teeth: Secondary | ICD-10-CM | POA: Diagnosis not present

## 2018-07-14 ENCOUNTER — Other Ambulatory Visit: Payer: Self-pay | Admitting: Obstetrics and Gynecology

## 2018-07-14 DIAGNOSIS — Z1231 Encounter for screening mammogram for malignant neoplasm of breast: Secondary | ICD-10-CM

## 2018-08-26 ENCOUNTER — Encounter: Payer: Self-pay | Admitting: Internal Medicine

## 2018-09-16 ENCOUNTER — Ambulatory Visit
Admission: RE | Admit: 2018-09-16 | Discharge: 2018-09-16 | Disposition: A | Discharge status: Home / Self Care | Payer: Medicare Other | Source: Ambulatory Visit | Attending: Obstetrics and Gynecology | Admitting: Obstetrics and Gynecology

## 2018-09-16 ENCOUNTER — Other Ambulatory Visit: Payer: Self-pay

## 2018-09-16 DIAGNOSIS — Z1231 Encounter for screening mammogram for malignant neoplasm of breast: Secondary | ICD-10-CM | POA: Diagnosis not present

## 2018-09-28 DIAGNOSIS — E559 Vitamin D deficiency, unspecified: Secondary | ICD-10-CM | POA: Diagnosis not present

## 2018-09-28 DIAGNOSIS — J309 Allergic rhinitis, unspecified: Secondary | ICD-10-CM | POA: Diagnosis not present

## 2018-09-28 DIAGNOSIS — R202 Paresthesia of skin: Secondary | ICD-10-CM | POA: Diagnosis not present

## 2018-09-28 DIAGNOSIS — Z23 Encounter for immunization: Secondary | ICD-10-CM | POA: Diagnosis not present

## 2018-09-28 DIAGNOSIS — M1990 Unspecified osteoarthritis, unspecified site: Secondary | ICD-10-CM | POA: Diagnosis not present

## 2018-09-28 DIAGNOSIS — Z0001 Encounter for general adult medical examination with abnormal findings: Secondary | ICD-10-CM | POA: Diagnosis not present

## 2018-09-28 DIAGNOSIS — Z1389 Encounter for screening for other disorder: Secondary | ICD-10-CM | POA: Diagnosis not present

## 2018-09-28 DIAGNOSIS — K219 Gastro-esophageal reflux disease without esophagitis: Secondary | ICD-10-CM | POA: Diagnosis not present

## 2018-09-28 DIAGNOSIS — Z79899 Other long term (current) drug therapy: Secondary | ICD-10-CM | POA: Diagnosis not present

## 2018-11-30 DIAGNOSIS — Z23 Encounter for immunization: Secondary | ICD-10-CM | POA: Diagnosis not present

## 2019-01-05 DIAGNOSIS — N8110 Cystocele, unspecified: Secondary | ICD-10-CM | POA: Diagnosis not present

## 2019-01-05 DIAGNOSIS — Z1389 Encounter for screening for other disorder: Secondary | ICD-10-CM | POA: Diagnosis not present

## 2019-01-05 DIAGNOSIS — N952 Postmenopausal atrophic vaginitis: Secondary | ICD-10-CM | POA: Diagnosis not present

## 2019-01-05 DIAGNOSIS — Z01419 Encounter for gynecological examination (general) (routine) without abnormal findings: Secondary | ICD-10-CM | POA: Diagnosis not present

## 2019-01-10 DIAGNOSIS — N8110 Cystocele, unspecified: Secondary | ICD-10-CM | POA: Diagnosis not present

## 2019-01-10 DIAGNOSIS — N952 Postmenopausal atrophic vaginitis: Secondary | ICD-10-CM | POA: Diagnosis not present

## 2019-04-07 DIAGNOSIS — R1084 Generalized abdominal pain: Secondary | ICD-10-CM | POA: Diagnosis not present

## 2019-04-13 ENCOUNTER — Encounter: Payer: Self-pay | Admitting: Internal Medicine

## 2019-04-27 ENCOUNTER — Ambulatory Visit: Payer: Medicare Other

## 2019-04-27 ENCOUNTER — Other Ambulatory Visit: Payer: Self-pay

## 2019-04-27 VITALS — Temp 97.7°F | Ht 59.0 in | Wt 125.8 lb

## 2019-04-27 DIAGNOSIS — Z01818 Encounter for other preprocedural examination: Secondary | ICD-10-CM

## 2019-04-27 DIAGNOSIS — Z1211 Encounter for screening for malignant neoplasm of colon: Secondary | ICD-10-CM

## 2019-04-27 NOTE — Progress Notes (Signed)
No egg or soy allergy known to patient  No issues with past sedation with any surgeries  or procedures, no intubation problems  No diet pills per patient No home 02 use per patient  No blood thinners per patient  Pt denies issues with constipation  No A fib or A flutter  EMMI video sent to pt's e mail   miralax prep   Due to the COVID-19 pandemic we are asking patients to follow these guidelines. Please only bring one care partner. Please be aware that your care partner may wait in the car in the parking lot or if they feel like they will be too hot to wait in the car, they may wait in the lobby on the 4th floor. All care partners are required to wear a mask the entire time (we do not have any that we can provide them), they need to practice social distancing, and we will do a Covid check for all patient's and care partners when you arrive. Also we will check their temperature and your temperature. If the care partner waits in their car they need to stay in the parking lot the entire time and we will call them on their cell phone when the patient is ready for discharge so they can bring the car to the front of the building. Also all patient's will need to wear a mask into building.

## 2019-05-09 ENCOUNTER — Other Ambulatory Visit: Payer: Self-pay | Admitting: Internal Medicine

## 2019-05-09 ENCOUNTER — Ambulatory Visit (INDEPENDENT_AMBULATORY_CARE_PROVIDER_SITE_OTHER): Payer: Medicare Other

## 2019-05-09 ENCOUNTER — Other Ambulatory Visit: Payer: Self-pay

## 2019-05-09 DIAGNOSIS — Z1159 Encounter for screening for other viral diseases: Secondary | ICD-10-CM | POA: Diagnosis not present

## 2019-05-09 LAB — SARS CORONAVIRUS 2 (TAT 6-24 HRS): SARS Coronavirus 2: NEGATIVE

## 2019-05-12 ENCOUNTER — Ambulatory Visit (AMBULATORY_SURGERY_CENTER): Payer: Medicare Other | Admitting: Internal Medicine

## 2019-05-12 ENCOUNTER — Other Ambulatory Visit: Payer: Self-pay

## 2019-05-12 ENCOUNTER — Encounter: Payer: Self-pay | Admitting: Internal Medicine

## 2019-05-12 VITALS — BP 111/67 | HR 63 | Temp 96.9°F | Resp 13 | Ht 59.0 in | Wt 125.8 lb

## 2019-05-12 DIAGNOSIS — Z1211 Encounter for screening for malignant neoplasm of colon: Secondary | ICD-10-CM

## 2019-05-12 DIAGNOSIS — Z86010 Personal history of colonic polyps: Secondary | ICD-10-CM | POA: Diagnosis not present

## 2019-05-12 DIAGNOSIS — I2510 Atherosclerotic heart disease of native coronary artery without angina pectoris: Secondary | ICD-10-CM | POA: Diagnosis not present

## 2019-05-12 MED ORDER — SODIUM CHLORIDE 0.9 % IV SOLN: 500.0000 mL | Freq: Once | INTRAVENOUS | Status: DC

## 2019-05-12 NOTE — Progress Notes (Signed)
Temperature taken by L.C., VS taken by D.T. 

## 2019-05-12 NOTE — Op Note (Signed)
[facility] Patient Name: Mandy Guzman Procedure Date: 05/12/2019 10:44 AM MRN: [id number] Endoscopist: Gatha Mayer , MD Age: 69 Referring MD:  Date of Birth: 09/04/1950 Gender: Female Account #: [id number] Procedure:                Colonoscopy Indications:              Screening for colorectal malignant neoplasm, Last                            colonoscopy: 2010 Medicines:                Propofol per Anesthesia, Monitored Anesthesia Care Procedure:                Pre-Anesthesia Assessment:                           - Prior to the procedure, a History and Physical                            was performed, and patient medications and                            allergies were reviewed. The patient's tolerance of                            previous anesthesia was also reviewed. The risks                            and benefits of the procedure and the sedation                            options and risks were discussed with the patient.                            All questions were answered, and informed consent                            was obtained. Prior Anticoagulants: The patient has                            taken no previous anticoagulant or antiplatelet                            agents. ASA Grade Assessment: II - A patient with                            mild systemic disease. After reviewing the risks                            and benefits, the patient was deemed in                            satisfactory condition to undergo the procedure.  After obtaining informed consent, the colonoscope                            was passed under direct vision. Throughout the                            procedure, the patient's blood pressure, pulse, and                            oxygen saturations were monitored continuously. The                            Colonoscope was introduced through the anus and                            advanced to the  the cecum, identified by                            appendiceal orifice and ileocecal valve. The                            colonoscopy was performed without difficulty. The                            patient tolerated the procedure well. The quality                            of the bowel preparation was excellent. The bowel                            preparation used was Miralax via split dose                            instruction. The ileocecal valve, appendiceal                            orifice, and rectum were photographed. Scope In: 10:53:27 AM Scope Out: 11:04:49 AM Scope Withdrawal Time: 0 hours 8 minutes 35 seconds  Total Procedure Duration: 0 hours 11 minutes 22 seconds  Findings:                 The perianal and digital rectal examinations were                            normal.                           The entire examined colon appeared normal on direct                            and retroflexion views. Complications:            No immediate complications. Estimated Blood Loss:     Estimated blood loss: none. Impression:               - The entire examined colon is  normal on direct and                            retroflexion views.                           - No specimens collected. Recommendation:           - Patient has a contact number available for                            emergencies. The signs and symptoms of potential                            delayed complications were discussed with the                            patient. Return to normal activities tomorrow.                            Written discharge instructions were provided to the                            patient.                           - Resume previous diet.                           - Continue present medications.                           - No repeat colonoscopy due to current age (42                            years or older) and the absence of colonic polyps. Iva Boop, MD 05/12/2019  11:12:15 AM This report has been signed electronically.

## 2019-05-12 NOTE — Patient Instructions (Addendum)
No polyps or cancer seen.  No recommendation for a routine repeat exam this time because after 75 generally do not repeat these tests. Always happy to reconsider and would investigate problems should you have them (hope not!)  I appreciate the opportunity to care for you. Iva Boop, MD, FACG  YOU HAD AN ENDOSCOPIC PROCEDURE TODAY AT THE [facility]:   Refer to the procedure report that was given to you for any specific questions about what was found during the examination.  If the procedure report does not answer your questions, please call your gastroenterologist to clarify.  If you requested that your care partner not be given the details of your procedure findings, then the procedure report has been included in a sealed envelope for you to review at your convenience later.  YOU SHOULD EXPECT: Some feelings of bloating in the abdomen. Passage of more gas than usual.  Walking can help get rid of the air that was put into your GI tract during the procedure and reduce the bloating. If you had a lower endoscopy (such as a colonoscopy or flexible sigmoidoscopy) you may notice spotting of blood in your stool or on the toilet paper. If you underwent a bowel prep for your procedure, you may not have a normal bowel movement for a few days.  Please Note:  You might notice some irritation and congestion in your nose or some drainage.  This is from the oxygen used during your procedure.  There is no need for concern and it should clear up in a day or so.  SYMPTOMS TO REPORT IMMEDIATELY:   Following lower endoscopy (colonoscopy or flexible sigmoidoscopy):  Excessive amounts of blood in the stool  Significant tenderness or worsening of abdominal pains  Swelling of the abdomen that is new, acute  Fever of 100F or higher  For urgent or emergent issues, a gastroenterologist can be reached at any hour by calling [phone]. Do not use MyChart messaging for urgent concerns.    DIET:   We do recommend a small meal at first, but then you may proceed to your regular diet.  Drink plenty of fluids but you should avoid alcoholic beverages for 24 hours.  ACTIVITY:  You should plan to take it easy for the rest of today and you should NOT DRIVE or use heavy machinery until tomorrow (because of the sedation medicines used during the test).    FOLLOW UP: Our staff will call the number listed on your records 48-72 hours following your procedure to check on you and address any questions or concerns that you may have regarding the information given to you following your procedure. If we do not reach you, we will leave a message.  We will attempt to reach you two times.  During this call, we will ask if you have developed any symptoms of COVID 19. If you develop any symptoms (ie: fever, flu-like symptoms, shortness of breath, cough etc.) before then, please call [phone].  If you test positive for Covid 19 in the 2 weeks post procedure, please call and report this information to [place].    If any biopsies were taken you will be contacted by phone or by letter within the next 1-3 weeks.  Please call us at [phone] if you have not heard about the biopsies in 3 weeks.    SIGNATURES/CONFIDENTIALITY: You and/or your care partner have signed paperwork which will be entered into your electronic medical record.  These signatures attest to  the fact that that the information above on your After Visit Summary has been reviewed and is understood.  Full responsibility of the confidentiality of this discharge information lies with you and/or your care-partner.

## 2019-05-12 NOTE — Progress Notes (Signed)
Report given to PACU, vss 

## 2019-05-12 NOTE — Progress Notes (Signed)
Pt's states no medical or surgical changes since previsit or office visit. 

## 2019-05-16 ENCOUNTER — Telehealth: Payer: Self-pay

## 2019-05-16 NOTE — Telephone Encounter (Signed)
  Follow up Call-  Call back number 05/12/2019  Post procedure Call Back phone  # [phone]  Permission to leave phone message Yes  Some recent data might be hidden     Patient questions:  Do you have a fever, pain , or abdominal swelling? No. Pain Score  0 *  Have you tolerated food without any problems? Yes.    Have you been able to return to your normal activities? Yes.    Do you have any questions about your discharge instructions: Diet   No. Medications  No. Follow up visit  No.  Do you have questions or concerns about your Care? No.  Actions: * If pain score is 4 or above: No action needed, pain <4.   1. Have you developed a fever since your procedure? No   2.   Have you had an respiratory symptoms (SOB or cough) since your procedure? no  3.   Have you tested positive for COVID 19 since your procedure no  4.   Have you had any family members/close contacts diagnosed with the COVID 19 since your procedure?  no   If yes to any of these questions please route to Laverna Peace, RN and Jennye Boroughs, Charity fundraiser.

## 2019-10-04 DIAGNOSIS — J309 Allergic rhinitis, unspecified: Secondary | ICD-10-CM | POA: Diagnosis not present

## 2019-10-04 DIAGNOSIS — R202 Paresthesia of skin: Secondary | ICD-10-CM | POA: Diagnosis not present

## 2019-10-04 DIAGNOSIS — Z13820 Encounter for screening for osteoporosis: Secondary | ICD-10-CM | POA: Diagnosis not present

## 2019-10-04 DIAGNOSIS — Z79899 Other long term (current) drug therapy: Secondary | ICD-10-CM | POA: Diagnosis not present

## 2019-10-04 DIAGNOSIS — Q619 Cystic kidney disease, unspecified: Secondary | ICD-10-CM | POA: Diagnosis not present

## 2019-10-04 DIAGNOSIS — Z1211 Encounter for screening for malignant neoplasm of colon: Secondary | ICD-10-CM | POA: Diagnosis not present

## 2019-10-04 DIAGNOSIS — Z0000 Encounter for general adult medical examination without abnormal findings: Secondary | ICD-10-CM | POA: Diagnosis not present

## 2019-10-04 DIAGNOSIS — E2839 Other primary ovarian failure: Secondary | ICD-10-CM | POA: Diagnosis not present

## 2019-10-04 DIAGNOSIS — K219 Gastro-esophageal reflux disease without esophagitis: Secondary | ICD-10-CM | POA: Diagnosis not present

## 2019-10-04 DIAGNOSIS — E559 Vitamin D deficiency, unspecified: Secondary | ICD-10-CM | POA: Diagnosis not present

## 2019-10-04 DIAGNOSIS — M1990 Unspecified osteoarthritis, unspecified site: Secondary | ICD-10-CM | POA: Diagnosis not present

## 2019-10-04 DIAGNOSIS — Z1389 Encounter for screening for other disorder: Secondary | ICD-10-CM | POA: Diagnosis not present

## 2019-10-25 ENCOUNTER — Other Ambulatory Visit: Payer: Self-pay | Admitting: Internal Medicine

## 2019-10-25 DIAGNOSIS — Z1231 Encounter for screening mammogram for malignant neoplasm of breast: Secondary | ICD-10-CM

## 2019-11-02 ENCOUNTER — Ambulatory Visit: Payer: Medicare Other

## 2019-11-11 ENCOUNTER — Ambulatory Visit
Admission: RE | Admit: 2019-11-11 | Discharge: 2019-11-11 | Disposition: A | Discharge status: Home / Self Care | Payer: Medicare Other | Source: Ambulatory Visit | Attending: Internal Medicine | Admitting: Internal Medicine

## 2019-11-11 ENCOUNTER — Other Ambulatory Visit: Payer: Self-pay

## 2019-11-11 DIAGNOSIS — Z1231 Encounter for screening mammogram for malignant neoplasm of breast: Secondary | ICD-10-CM

## 2019-12-01 DIAGNOSIS — R252 Cramp and spasm: Secondary | ICD-10-CM | POA: Diagnosis not present

## 2019-12-01 DIAGNOSIS — M792 Neuralgia and neuritis, unspecified: Secondary | ICD-10-CM | POA: Diagnosis not present

## 2019-12-01 DIAGNOSIS — K5900 Constipation, unspecified: Secondary | ICD-10-CM | POA: Diagnosis not present

## 2019-12-01 DIAGNOSIS — M25562 Pain in left knee: Secondary | ICD-10-CM | POA: Diagnosis not present

## 2020-03-22 DIAGNOSIS — K120 Recurrent oral aphthae: Secondary | ICD-10-CM | POA: Diagnosis not present

## 2020-03-22 DIAGNOSIS — L820 Inflamed seborrheic keratosis: Secondary | ICD-10-CM | POA: Diagnosis not present

## 2020-03-22 DIAGNOSIS — B001 Herpesviral vesicular dermatitis: Secondary | ICD-10-CM | POA: Diagnosis not present

## 2020-03-22 DIAGNOSIS — Z2821 Immunization not carried out because of patient refusal: Secondary | ICD-10-CM | POA: Diagnosis not present

## 2020-04-11 DIAGNOSIS — J301 Allergic rhinitis due to pollen: Secondary | ICD-10-CM | POA: Diagnosis not present

## 2020-04-11 DIAGNOSIS — K1121 Acute sialoadenitis: Secondary | ICD-10-CM | POA: Diagnosis not present

## 2020-05-02 DIAGNOSIS — Z01419 Encounter for gynecological examination (general) (routine) without abnormal findings: Secondary | ICD-10-CM | POA: Diagnosis not present

## 2020-05-02 DIAGNOSIS — K1121 Acute sialoadenitis: Secondary | ICD-10-CM | POA: Diagnosis not present

## 2020-05-02 DIAGNOSIS — N8110 Cystocele, unspecified: Secondary | ICD-10-CM | POA: Diagnosis not present

## 2020-05-02 DIAGNOSIS — N952 Postmenopausal atrophic vaginitis: Secondary | ICD-10-CM | POA: Diagnosis not present

## 2020-05-02 DIAGNOSIS — N898 Other specified noninflammatory disorders of vagina: Secondary | ICD-10-CM | POA: Diagnosis not present

## 2020-05-05 IMAGING — MG DIGITAL SCREENING BILATERAL MAMMOGRAM WITH TOMO AND CAD
6 of 10 series · 6 of 30 positions shown · non-contrast
Comparison: Previous exam(s).

CLINICAL DATA: Screening.

EXAM:
DIGITAL SCREENING BILATERAL MAMMOGRAM WITH TOMO AND CAD

[L CC synth-2D]
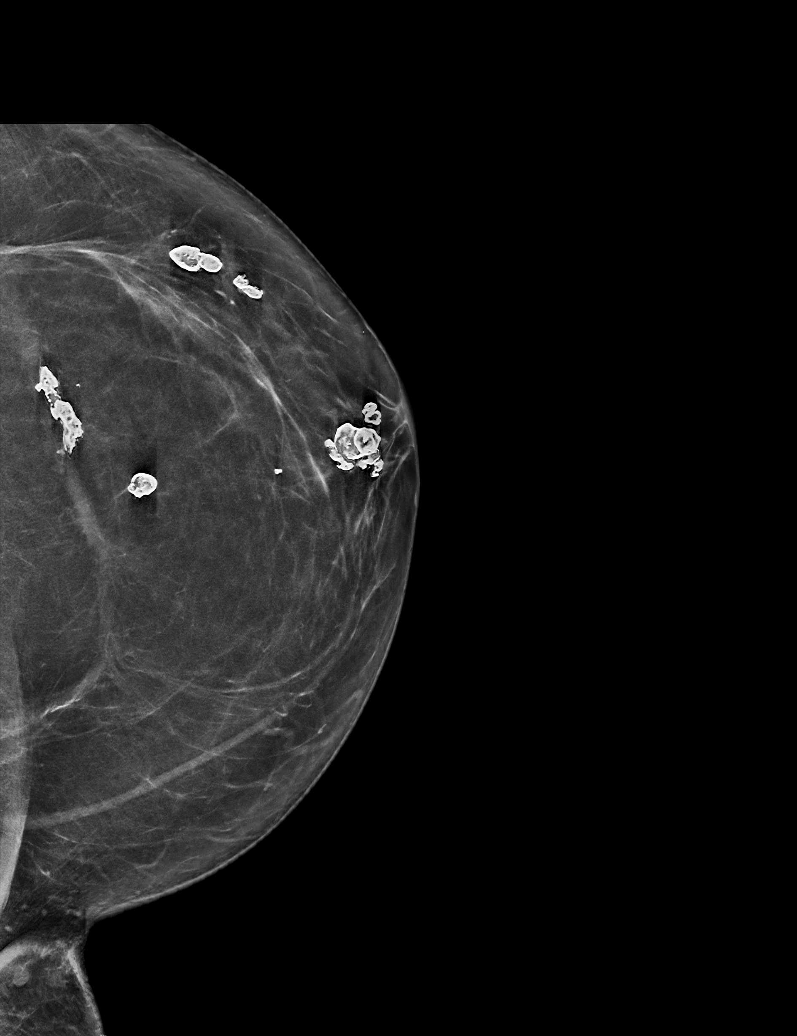

[R MLO synth-2D (1 of 2)]
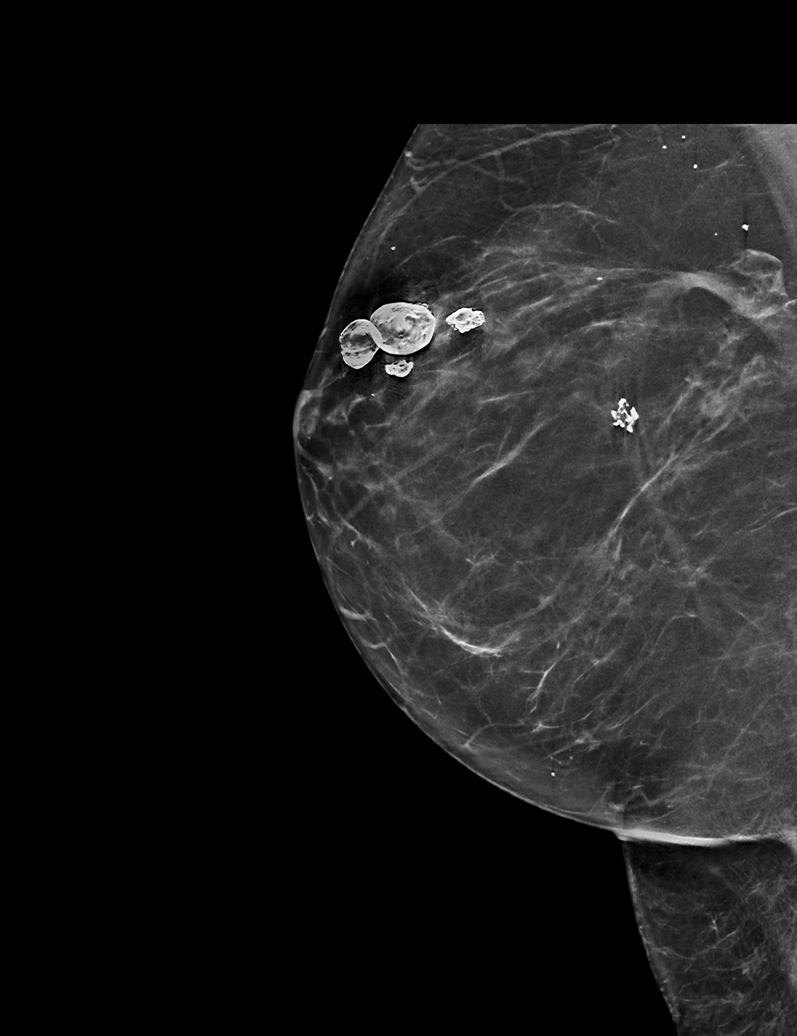

[R CC synth-2D]
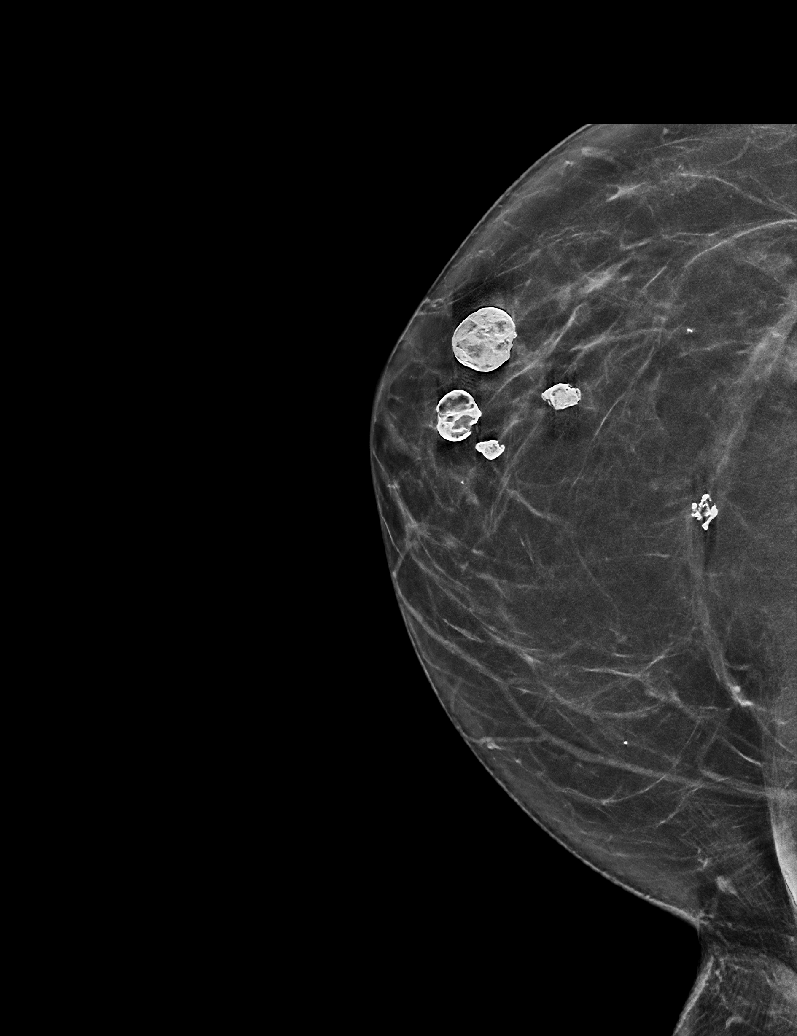

[R MLO synth-2D (2 of 2)]
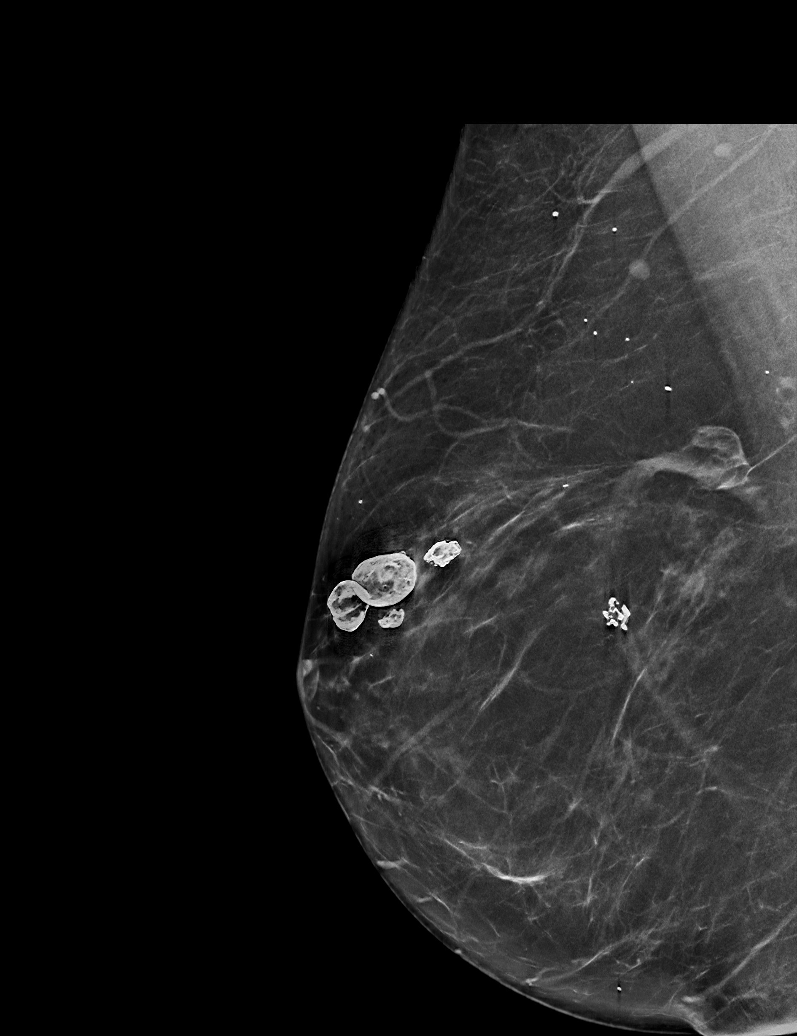

[L MLO synth-2D]
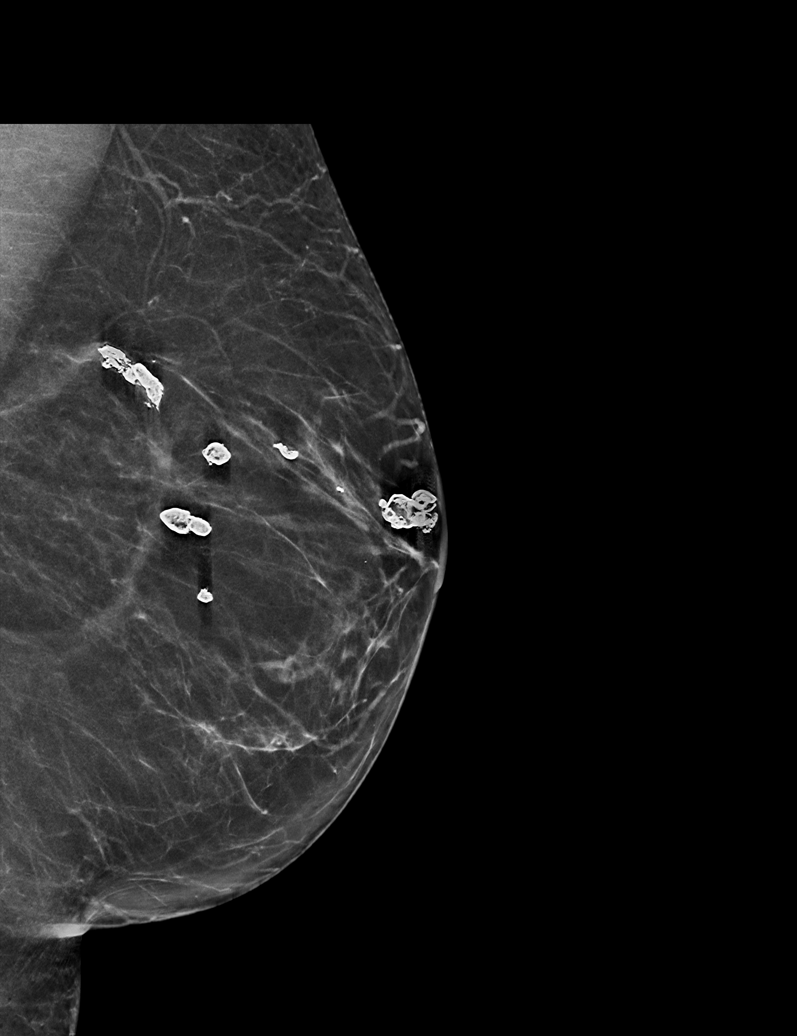

[R MLO tomo · tomo slice 31/62.0]
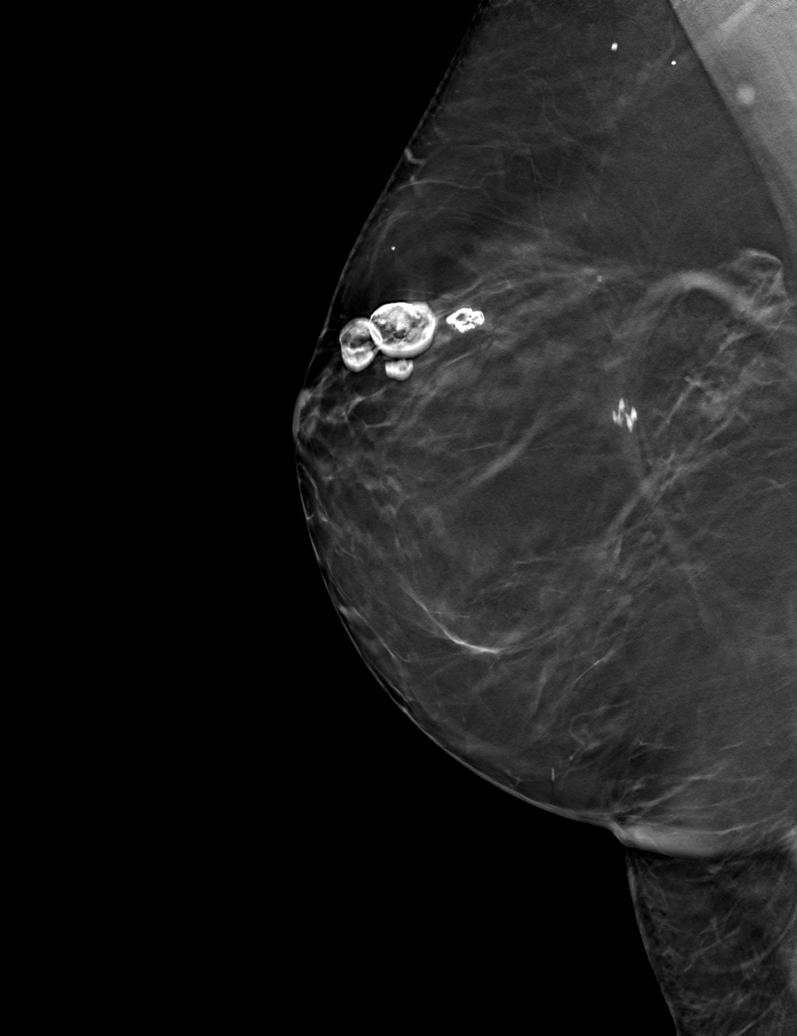

[6 of 30 positions shown; findings below may reference images not displayed]

ACR Breast Density Category b: There are scattered areas of
fibroglandular density.
FINDINGS: There are no findings suspicious for malignancy. Images were
processed with CAD.
IMPRESSION: No mammographic evidence of malignancy. A result letter of this
screening mammogram will be mailed directly to the patient.

RECOMMENDATION:
Screening mammogram in one year. (Code:[id number])

BI-RADS CATEGORY  1: Negative.

## 2020-08-02 DIAGNOSIS — J301 Allergic rhinitis due to pollen: Secondary | ICD-10-CM | POA: Diagnosis not present

## 2020-08-02 DIAGNOSIS — K1120 Sialoadenitis, unspecified: Secondary | ICD-10-CM | POA: Diagnosis not present

## 2020-08-02 DIAGNOSIS — R682 Dry mouth, unspecified: Secondary | ICD-10-CM | POA: Diagnosis not present

## 2020-08-09 DIAGNOSIS — K219 Gastro-esophageal reflux disease without esophagitis: Secondary | ICD-10-CM | POA: Diagnosis not present

## 2020-08-09 DIAGNOSIS — J40 Bronchitis, not specified as acute or chronic: Secondary | ICD-10-CM | POA: Diagnosis not present

## 2020-10-08 DIAGNOSIS — J309 Allergic rhinitis, unspecified: Secondary | ICD-10-CM | POA: Diagnosis not present

## 2020-10-08 DIAGNOSIS — M1990 Unspecified osteoarthritis, unspecified site: Secondary | ICD-10-CM | POA: Diagnosis not present

## 2020-10-08 DIAGNOSIS — Z0000 Encounter for general adult medical examination without abnormal findings: Secondary | ICD-10-CM | POA: Diagnosis not present

## 2020-10-08 DIAGNOSIS — E785 Hyperlipidemia, unspecified: Secondary | ICD-10-CM | POA: Diagnosis not present

## 2020-10-08 DIAGNOSIS — E559 Vitamin D deficiency, unspecified: Secondary | ICD-10-CM | POA: Diagnosis not present

## 2020-10-08 DIAGNOSIS — Q619 Cystic kidney disease, unspecified: Secondary | ICD-10-CM | POA: Diagnosis not present

## 2020-10-08 DIAGNOSIS — L820 Inflamed seborrheic keratosis: Secondary | ICD-10-CM | POA: Diagnosis not present

## 2020-10-08 DIAGNOSIS — M85859 Other specified disorders of bone density and structure, unspecified thigh: Secondary | ICD-10-CM | POA: Diagnosis not present

## 2020-10-08 DIAGNOSIS — Z1389 Encounter for screening for other disorder: Secondary | ICD-10-CM | POA: Diagnosis not present

## 2020-10-08 DIAGNOSIS — Z7189 Other specified counseling: Secondary | ICD-10-CM | POA: Diagnosis not present

## 2020-10-08 DIAGNOSIS — K219 Gastro-esophageal reflux disease without esophagitis: Secondary | ICD-10-CM | POA: Diagnosis not present

## 2020-10-10 ENCOUNTER — Other Ambulatory Visit: Payer: Self-pay | Admitting: Internal Medicine

## 2020-10-10 DIAGNOSIS — M8580 Other specified disorders of bone density and structure, unspecified site: Secondary | ICD-10-CM

## 2020-10-26 DIAGNOSIS — H43812 Vitreous degeneration, left eye: Secondary | ICD-10-CM | POA: Diagnosis not present

## 2020-11-27 DIAGNOSIS — H43812 Vitreous degeneration, left eye: Secondary | ICD-10-CM | POA: Diagnosis not present

## 2020-12-07 ENCOUNTER — Other Ambulatory Visit: Payer: Self-pay | Admitting: Obstetrics and Gynecology

## 2020-12-07 DIAGNOSIS — Z1231 Encounter for screening mammogram for malignant neoplasm of breast: Secondary | ICD-10-CM

## 2020-12-10 ENCOUNTER — Other Ambulatory Visit: Payer: Self-pay

## 2020-12-10 ENCOUNTER — Ambulatory Visit
Admission: RE | Admit: 2020-12-10 | Discharge: 2020-12-10 | Disposition: A | Discharge status: Home / Self Care | Payer: Medicare Other | Source: Ambulatory Visit | Attending: Obstetrics and Gynecology | Admitting: Obstetrics and Gynecology

## 2020-12-10 DIAGNOSIS — Z1231 Encounter for screening mammogram for malignant neoplasm of breast: Secondary | ICD-10-CM

## 2021-04-05 ENCOUNTER — Ambulatory Visit
Admission: RE | Admit: 2021-04-05 | Discharge: 2021-04-05 | Disposition: A | Discharge status: Home / Self Care | Payer: Medicare Other | Source: Ambulatory Visit | Attending: Internal Medicine | Admitting: Internal Medicine

## 2021-04-05 DIAGNOSIS — M85851 Other specified disorders of bone density and structure, right thigh: Secondary | ICD-10-CM | POA: Diagnosis not present

## 2021-04-05 DIAGNOSIS — Z780 Asymptomatic menopausal state: Secondary | ICD-10-CM | POA: Diagnosis not present

## 2021-04-05 DIAGNOSIS — M8580 Other specified disorders of bone density and structure, unspecified site: Secondary | ICD-10-CM

## 2021-04-05 DIAGNOSIS — M810 Age-related osteoporosis without current pathological fracture: Secondary | ICD-10-CM | POA: Diagnosis not present

## 2021-04-12 DIAGNOSIS — M810 Age-related osteoporosis without current pathological fracture: Secondary | ICD-10-CM | POA: Diagnosis not present

## 2021-04-12 DIAGNOSIS — M79602 Pain in left arm: Secondary | ICD-10-CM | POA: Diagnosis not present

## 2021-05-03 DIAGNOSIS — M67912 Unspecified disorder of synovium and tendon, left shoulder: Secondary | ICD-10-CM | POA: Diagnosis not present

## 2021-05-03 DIAGNOSIS — M25512 Pain in left shoulder: Secondary | ICD-10-CM | POA: Diagnosis not present

## 2021-05-23 DIAGNOSIS — Z01411 Encounter for gynecological examination (general) (routine) with abnormal findings: Secondary | ICD-10-CM | POA: Diagnosis not present

## 2021-05-23 DIAGNOSIS — N952 Postmenopausal atrophic vaginitis: Secondary | ICD-10-CM | POA: Diagnosis not present

## 2021-05-23 DIAGNOSIS — N8110 Cystocele, unspecified: Secondary | ICD-10-CM | POA: Diagnosis not present

## 2021-09-18 DIAGNOSIS — H40013 Open angle with borderline findings, low risk, bilateral: Secondary | ICD-10-CM | POA: Diagnosis not present

## 2021-10-03 DIAGNOSIS — W5503XA Scratched by cat, initial encounter: Secondary | ICD-10-CM | POA: Diagnosis not present

## 2021-10-03 DIAGNOSIS — K469 Unspecified abdominal hernia without obstruction or gangrene: Secondary | ICD-10-CM | POA: Diagnosis not present

## 2021-10-03 DIAGNOSIS — S61412A Laceration without foreign body of left hand, initial encounter: Secondary | ICD-10-CM | POA: Diagnosis not present

## 2021-10-11 ENCOUNTER — Ambulatory Visit
Admission: RE | Admit: 2021-10-11 | Discharge: 2021-10-11 | Disposition: A | Discharge status: Home / Self Care | Payer: Medicare Other | Source: Ambulatory Visit | Attending: Internal Medicine | Admitting: Internal Medicine

## 2021-10-11 ENCOUNTER — Other Ambulatory Visit: Payer: Self-pay | Admitting: Internal Medicine

## 2021-10-11 DIAGNOSIS — N281 Cyst of kidney, acquired: Secondary | ICD-10-CM | POA: Diagnosis not present

## 2021-10-11 DIAGNOSIS — J309 Allergic rhinitis, unspecified: Secondary | ICD-10-CM | POA: Diagnosis not present

## 2021-10-11 DIAGNOSIS — Z1159 Encounter for screening for other viral diseases: Secondary | ICD-10-CM | POA: Diagnosis not present

## 2021-10-11 DIAGNOSIS — K219 Gastro-esophageal reflux disease without esophagitis: Secondary | ICD-10-CM | POA: Diagnosis not present

## 2021-10-11 DIAGNOSIS — K4090 Unilateral inguinal hernia, without obstruction or gangrene, not specified as recurrent: Secondary | ICD-10-CM

## 2021-10-11 DIAGNOSIS — Z1231 Encounter for screening mammogram for malignant neoplasm of breast: Secondary | ICD-10-CM | POA: Diagnosis not present

## 2021-10-11 DIAGNOSIS — Z0000 Encounter for general adult medical examination without abnormal findings: Secondary | ICD-10-CM | POA: Diagnosis not present

## 2021-10-11 DIAGNOSIS — M1990 Unspecified osteoarthritis, unspecified site: Secondary | ICD-10-CM | POA: Diagnosis not present

## 2021-10-11 DIAGNOSIS — E559 Vitamin D deficiency, unspecified: Secondary | ICD-10-CM | POA: Diagnosis not present

## 2021-10-11 DIAGNOSIS — Z1331 Encounter for screening for depression: Secondary | ICD-10-CM | POA: Diagnosis not present

## 2021-10-11 DIAGNOSIS — E785 Hyperlipidemia, unspecified: Secondary | ICD-10-CM | POA: Diagnosis not present

## 2021-10-11 DIAGNOSIS — M810 Age-related osteoporosis without current pathological fracture: Secondary | ICD-10-CM | POA: Diagnosis not present

## 2021-10-18 DIAGNOSIS — N281 Cyst of kidney, acquired: Secondary | ICD-10-CM | POA: Diagnosis not present

## 2021-10-23 DIAGNOSIS — N281 Cyst of kidney, acquired: Secondary | ICD-10-CM | POA: Diagnosis not present

## 2021-10-23 DIAGNOSIS — M810 Age-related osteoporosis without current pathological fracture: Secondary | ICD-10-CM | POA: Diagnosis not present

## 2021-10-31 DIAGNOSIS — K4090 Unilateral inguinal hernia, without obstruction or gangrene, not specified as recurrent: Secondary | ICD-10-CM | POA: Diagnosis not present

## 2022-01-17 DIAGNOSIS — K4090 Unilateral inguinal hernia, without obstruction or gangrene, not specified as recurrent: Secondary | ICD-10-CM | POA: Diagnosis not present

## 2022-01-21 ENCOUNTER — Other Ambulatory Visit: Payer: Self-pay | Admitting: Obstetrics and Gynecology

## 2022-01-21 DIAGNOSIS — Z1231 Encounter for screening mammogram for malignant neoplasm of breast: Secondary | ICD-10-CM

## 2022-03-10 DIAGNOSIS — L308 Other specified dermatitis: Secondary | ICD-10-CM | POA: Diagnosis not present

## 2022-03-10 DIAGNOSIS — L570 Actinic keratosis: Secondary | ICD-10-CM | POA: Diagnosis not present

## 2022-03-10 DIAGNOSIS — X32XXXA Exposure to sunlight, initial encounter: Secondary | ICD-10-CM | POA: Diagnosis not present

## 2022-03-10 DIAGNOSIS — L821 Other seborrheic keratosis: Secondary | ICD-10-CM | POA: Diagnosis not present

## 2022-03-20 DIAGNOSIS — H2513 Age-related nuclear cataract, bilateral: Secondary | ICD-10-CM | POA: Diagnosis not present

## 2022-03-20 DIAGNOSIS — H40013 Open angle with borderline findings, low risk, bilateral: Secondary | ICD-10-CM | POA: Diagnosis not present

## 2022-03-21 ENCOUNTER — Ambulatory Visit
Admission: RE | Admit: 2022-03-21 | Discharge: 2022-03-21 | Disposition: A | Discharge status: Home / Self Care | Payer: Medicare Other | Source: Ambulatory Visit | Attending: Obstetrics and Gynecology | Admitting: Obstetrics and Gynecology

## 2022-03-21 DIAGNOSIS — Z1231 Encounter for screening mammogram for malignant neoplasm of breast: Secondary | ICD-10-CM

## 2022-04-07 DIAGNOSIS — X32XXXD Exposure to sunlight, subsequent encounter: Secondary | ICD-10-CM | POA: Diagnosis not present

## 2022-04-07 DIAGNOSIS — L570 Actinic keratosis: Secondary | ICD-10-CM | POA: Diagnosis not present

## 2022-07-16 DIAGNOSIS — Z01419 Encounter for gynecological examination (general) (routine) without abnormal findings: Secondary | ICD-10-CM | POA: Diagnosis not present

## 2022-07-16 DIAGNOSIS — Z780 Asymptomatic menopausal state: Secondary | ICD-10-CM | POA: Diagnosis not present

## 2022-07-16 DIAGNOSIS — N952 Postmenopausal atrophic vaginitis: Secondary | ICD-10-CM | POA: Diagnosis not present

## 2022-07-16 DIAGNOSIS — N8111 Cystocele, midline: Secondary | ICD-10-CM | POA: Diagnosis not present

## 2022-08-22 DIAGNOSIS — D699 Hemorrhagic condition, unspecified: Secondary | ICD-10-CM | POA: Diagnosis not present

## 2022-08-22 DIAGNOSIS — M810 Age-related osteoporosis without current pathological fracture: Secondary | ICD-10-CM | POA: Diagnosis not present

## 2022-08-22 DIAGNOSIS — W57XXXA Bitten or stung by nonvenomous insect and other nonvenomous arthropods, initial encounter: Secondary | ICD-10-CM | POA: Diagnosis not present

## 2022-10-23 DIAGNOSIS — E785 Hyperlipidemia, unspecified: Secondary | ICD-10-CM | POA: Diagnosis not present

## 2022-10-23 DIAGNOSIS — Z1231 Encounter for screening mammogram for malignant neoplasm of breast: Secondary | ICD-10-CM | POA: Diagnosis not present

## 2022-10-23 DIAGNOSIS — J309 Allergic rhinitis, unspecified: Secondary | ICD-10-CM | POA: Diagnosis not present

## 2022-10-23 DIAGNOSIS — Z1331 Encounter for screening for depression: Secondary | ICD-10-CM | POA: Diagnosis not present

## 2022-10-23 DIAGNOSIS — Z0000 Encounter for general adult medical examination without abnormal findings: Secondary | ICD-10-CM | POA: Diagnosis not present

## 2022-10-23 DIAGNOSIS — Z1211 Encounter for screening for malignant neoplasm of colon: Secondary | ICD-10-CM | POA: Diagnosis not present

## 2022-10-23 DIAGNOSIS — E559 Vitamin D deficiency, unspecified: Secondary | ICD-10-CM | POA: Diagnosis not present

## 2022-10-23 DIAGNOSIS — Z23 Encounter for immunization: Secondary | ICD-10-CM | POA: Diagnosis not present

## 2022-10-23 DIAGNOSIS — K219 Gastro-esophageal reflux disease without esophagitis: Secondary | ICD-10-CM | POA: Diagnosis not present

## 2022-10-23 DIAGNOSIS — M810 Age-related osteoporosis without current pathological fracture: Secondary | ICD-10-CM | POA: Diagnosis not present

## 2023-02-11 DIAGNOSIS — H00021 Hordeolum internum right upper eyelid: Secondary | ICD-10-CM | POA: Diagnosis not present

## 2023-05-04 ENCOUNTER — Other Ambulatory Visit: Payer: Self-pay | Admitting: Gynecology

## 2023-05-04 DIAGNOSIS — Z1231 Encounter for screening mammogram for malignant neoplasm of breast: Secondary | ICD-10-CM

## 2023-05-27 ENCOUNTER — Ambulatory Visit
Admission: RE | Admit: 2023-05-27 | Discharge: 2023-05-27 | Disposition: A | Discharge status: Home / Self Care | Source: Ambulatory Visit | Attending: Gynecology | Admitting: Gynecology

## 2023-05-27 DIAGNOSIS — Z1231 Encounter for screening mammogram for malignant neoplasm of breast: Secondary | ICD-10-CM | POA: Diagnosis not present

## 2023-06-01 DIAGNOSIS — E785 Hyperlipidemia, unspecified: Secondary | ICD-10-CM | POA: Diagnosis not present

## 2023-06-01 DIAGNOSIS — M810 Age-related osteoporosis without current pathological fracture: Secondary | ICD-10-CM | POA: Diagnosis not present

## 2023-07-20 DIAGNOSIS — Z01411 Encounter for gynecological examination (general) (routine) with abnormal findings: Secondary | ICD-10-CM | POA: Diagnosis not present

## 2023-07-20 DIAGNOSIS — N8110 Cystocele, unspecified: Secondary | ICD-10-CM | POA: Diagnosis not present

## 2023-07-20 DIAGNOSIS — Z90710 Acquired absence of both cervix and uterus: Secondary | ICD-10-CM | POA: Diagnosis not present

## 2023-07-20 DIAGNOSIS — N952 Postmenopausal atrophic vaginitis: Secondary | ICD-10-CM | POA: Diagnosis not present

## 2023-08-13 DIAGNOSIS — N8111 Cystocele, midline: Secondary | ICD-10-CM | POA: Diagnosis not present

## 2023-09-18 DIAGNOSIS — L03115 Cellulitis of right lower limb: Secondary | ICD-10-CM | POA: Diagnosis not present

## 2023-09-18 DIAGNOSIS — N760 Acute vaginitis: Secondary | ICD-10-CM | POA: Diagnosis not present

## 2023-09-24 DIAGNOSIS — N8111 Cystocele, midline: Secondary | ICD-10-CM | POA: Diagnosis not present

## 2023-09-29 DIAGNOSIS — L0291 Cutaneous abscess, unspecified: Secondary | ICD-10-CM | POA: Diagnosis not present

## 2023-09-29 DIAGNOSIS — L0390 Cellulitis, unspecified: Secondary | ICD-10-CM | POA: Diagnosis not present

## 2023-10-01 DIAGNOSIS — N8111 Cystocele, midline: Secondary | ICD-10-CM | POA: Diagnosis not present

## 2023-10-06 DIAGNOSIS — L0390 Cellulitis, unspecified: Secondary | ICD-10-CM | POA: Diagnosis not present

## 2023-10-06 DIAGNOSIS — L0291 Cutaneous abscess, unspecified: Secondary | ICD-10-CM | POA: Diagnosis not present

## 2023-10-07 DIAGNOSIS — N3946 Mixed incontinence: Secondary | ICD-10-CM | POA: Diagnosis not present

## 2023-10-29 DIAGNOSIS — N8111 Cystocele, midline: Secondary | ICD-10-CM | POA: Diagnosis not present

## 2023-11-18 DIAGNOSIS — J301 Allergic rhinitis due to pollen: Secondary | ICD-10-CM | POA: Diagnosis not present

## 2023-11-18 DIAGNOSIS — H6123 Impacted cerumen, bilateral: Secondary | ICD-10-CM | POA: Diagnosis not present

## 2023-11-18 DIAGNOSIS — H93293 Other abnormal auditory perceptions, bilateral: Secondary | ICD-10-CM | POA: Diagnosis not present

## 2023-11-23 ENCOUNTER — Other Ambulatory Visit: Payer: Self-pay | Admitting: Urology

## 2023-11-26 DIAGNOSIS — Z0000 Encounter for general adult medical examination without abnormal findings: Secondary | ICD-10-CM | POA: Diagnosis not present

## 2023-11-26 DIAGNOSIS — E559 Vitamin D deficiency, unspecified: Secondary | ICD-10-CM | POA: Diagnosis not present

## 2023-11-26 DIAGNOSIS — N8110 Cystocele, unspecified: Secondary | ICD-10-CM | POA: Diagnosis not present

## 2023-11-26 DIAGNOSIS — M810 Age-related osteoporosis without current pathological fracture: Secondary | ICD-10-CM | POA: Diagnosis not present

## 2023-11-26 DIAGNOSIS — M1990 Unspecified osteoarthritis, unspecified site: Secondary | ICD-10-CM | POA: Diagnosis not present

## 2023-11-26 DIAGNOSIS — K219 Gastro-esophageal reflux disease without esophagitis: Secondary | ICD-10-CM | POA: Diagnosis not present

## 2023-11-26 DIAGNOSIS — J309 Allergic rhinitis, unspecified: Secondary | ICD-10-CM | POA: Diagnosis not present

## 2023-11-26 DIAGNOSIS — Z1331 Encounter for screening for depression: Secondary | ICD-10-CM | POA: Diagnosis not present

## 2023-11-26 DIAGNOSIS — E785 Hyperlipidemia, unspecified: Secondary | ICD-10-CM | POA: Diagnosis not present

## 2023-11-26 DIAGNOSIS — N281 Cyst of kidney, acquired: Secondary | ICD-10-CM | POA: Diagnosis not present

## 2023-12-11 NOTE — Progress Notes (Addendum)
 COVID Vaccine Completed:  Date of COVID positive in last 90 days:  PCP - Julienne Pinal, MD Cardiologist - n/a  Chest x-ray - N/A EKG - N/A Stress Test - N/A ECHO - N/A Cardiac Cath - n/a Pacemaker/ICD device last checked:N/A Spinal Cord Stimulator:N/A  Bowel Prep - Follow a clear liquid diet the day before surgery, miralax  Sleep Study - N/A CPAP -   Fasting Blood Sugar - N/A Checks Blood Sugar _____ times a day  Last dose of GLP1 agonist-  N/A GLP1 instructions:  Do not take after     Last dose of SGLT-2 inhibitors-  N/A SGLT-2 instructions:  Do not take after     Blood Thinner Instructions: N/A Last dose:   Time: Aspirin Instructions:N/A Last Dose:  Activity level: Can go up a flight of stairs and perform activities of daily living without stopping and without symptoms of chest pain or shortness of breath.   Anesthesia review: N/A  Patient denies shortness of breath, fever, cough and chest pain at PAT appointment  Patient verbalized understanding of instructions that were given to them at the PAT appointment. Patient was also instructed that they will need to review over the PAT instructions again at home before surgery.

## 2023-12-11 NOTE — Patient Instructions (Addendum)
 SURGICAL WAITING ROOM VISITATION  Patients having surgery or a procedure may have no more than 2 support people in the waiting area - these visitors may rotate.    Children under the age of 28 must have an adult with them who is not the patient.  Visitors with respiratory illnesses are discouraged from visiting and should remain at home.  If the patient needs to stay at the hospital during part of their recovery, the visitor guidelines for inpatient rooms apply. Pre-op nurse will coordinate an appropriate time for 1 support person to accompany patient in pre-op.  This support person may not rotate.    Please refer to the [facility] website for the visitor guidelines for Inpatients (after your surgery is over and you are in a regular room).    Your procedure is scheduled on: 12/23/23    Report to [facility] Main Entrance    Report to admitting at 5:15 AM   Call this number if you have problems the morning of surgery [phone]   Follow a clear liquid diet the day before surgery.   Water  Non-Citrus Juices (without pulp, NO RED-Apple, White grape, White cranberry) Black Coffee (NO MILK/CREAM OR CREAMERS, sugar ok)  Clear Tea (NO MILK/CREAM OR CREAMERS, sugar ok) regular and decaf                             Plain Jell-O (NO RED)                                           Fruit ices (not with fruit pulp, NO RED)                                     Popsicles (NO RED)                                                               Sports drinks like Gatorade (NO RED)              Nothing to drink after midnight            If you have questions, please contact your surgeon's office.   FOLLOW BOWEL PREP AND ANY ADDITIONAL PRE OP INSTRUCTIONS YOU RECEIVED FROM YOUR SURGEON'S OFFICE!!!     Oral Hygiene is also important to reduce your risk of infection.                                    Remember - BRUSH YOUR TEETH THE MORNING OF SURGERY WITH YOUR REGULAR  TOOTHPASTE  DENTURES WILL BE REMOVED PRIOR TO SURGERY PLEASE DO NOT APPLY Poly grip OR ADHESIVES!!!   Stop all vitamins and herbal supplements 7 days before surgery.   Take these medicines the morning of surgery with A SIP OF WATER : None  You may not have any metal on your body including hair pins, jewelry, and body piercing             Do not wear make-up, lotions, powders, perfumes, or deodorant  Do not wear nail polish including gel and S&S, artificial/acrylic nails, or any other type of covering on natural nails including finger and toenails. If you have artificial nails, gel coating, etc. that needs to be removed by a nail salon please have this removed prior to surgery or surgery may need to be canceled/ delayed if the surgeon/ anesthesia feels like they are unable to be safely monitored.   Do not shave  48 hours prior to surgery.    Do not bring valuables to the hospital. [facility] IS NOT             RESPONSIBLE   FOR VALUABLES.   Contacts, glasses, dentures or bridgework may not be worn into surgery.   Bring small overnight bag day of surgery.   DO NOT BRING YOUR HOME MEDICATIONS TO THE HOSPITAL. PHARMACY WILL DISPENSE MEDICATIONS LISTED ON YOUR MEDICATION LIST TO YOU DURING YOUR ADMISSION IN THE HOSPITAL!   Special Instructions: Bring a copy of your healthcare power of attorney and living will documents the day of surgery if you haven't scanned them before.              Please read over the following fact sheets you were given: IF YOU HAVE QUESTIONS ABOUT YOUR PRE-OP INSTRUCTIONS PLEASE CALL [phone] [place].   If you received a COVID test during your pre-op visit  it is requested that you wear a mask when out in public, stay away from anyone that may not be feeling well and notify your surgeon if you develop symptoms. If you test positive for Covid or have been in contact with anyone that has tested positive in the last 10 days please  notify you surgeon.    [facility] - Preparing for Surgery Before surgery, you can play an important role.  Because skin is not sterile, your skin needs to be as free of germs as possible.  You can reduce the number of germs on your skin by washing with CHG (chlorahexidine gluconate) soap before surgery.  CHG is an antiseptic cleaner which kills germs and bonds with the skin to continue killing germs even after washing. Please DO NOT use if you have an allergy to CHG or antibacterial soaps.  If your skin becomes reddened/irritated stop using the CHG and inform your nurse when you arrive at Short Stay. Do not shave (including legs and underarms) for at least 48 hours prior to the first CHG shower.  You may shave your face/neck.  Please follow these instructions carefully:  1.  Shower with CHG Soap the night before surgery ONLY (DO NOT USE THE SOAP THE MORNING OF SURGERY).  2.  If you choose to wash your hair, wash your hair first as usual with your normal  shampoo.  3.  After you shampoo, rinse your hair and body thoroughly to remove the shampoo.                             4.  Use CHG as you would any other liquid soap.  You can apply chg directly to the skin and wash.  Gently with a scrungie or clean washcloth.  5.  Apply the CHG Soap to your body ONLY FROM THE NECK  DOWN.   Do   not use on face/ open                           Wound or open sores. Avoid contact with eyes, ears mouth and   genitals (private parts).                       Wash face,  Genitals (private parts) with your normal soap.             6.  Wash thoroughly, paying special attention to the area where your    surgery  will be performed.  7.  Thoroughly rinse your body with warm water  from the neck down.  8.  DO NOT shower/wash with your normal soap after using and rinsing off the CHG Soap.                9.  Pat yourself dry with a clean towel.            10.  Wear clean pajamas.            11.  Place clean sheets on your bed  the night of your first shower and do not  sleep with pets. Day of Surgery : Do not apply any CHG, lotions/deodorants the morning of surgery.  Please wear clean clothes to the hospital/surgery center.  FAILURE TO FOLLOW THESE INSTRUCTIONS MAY RESULT IN THE CANCELLATION OF YOUR SURGERY  PATIENT SIGNATURE_________________________________  NURSE SIGNATURE__________________________________  ________________________________________________________________________ WHAT IS A BLOOD TRANSFUSION? Blood Transfusion Information  A transfusion is the replacement of blood or some of its parts. Blood is made up of multiple cells which provide different functions. Red blood cells carry oxygen and are used for blood loss replacement. White blood cells fight against infection. Platelets control bleeding. Plasma helps clot blood. Other blood products are available for specialized needs, such as hemophilia or other clotting disorders. BEFORE THE TRANSFUSION  Who gives blood for transfusions?  Healthy volunteers who are fully evaluated to make sure their blood is safe. This is blood bank blood. Transfusion therapy is the safest it has ever been in the practice of medicine. Before blood is taken from a donor, a complete history is taken to make sure that person has no history of diseases nor engages in risky social behavior (examples are intravenous drug use or sexual activity with multiple partners). The donor's travel history is screened to minimize risk of transmitting infections, such as malaria. The donated blood is tested for signs of infectious diseases, such as HIV and hepatitis. The blood is then tested to be sure it is compatible with you in order to minimize the chance of a transfusion reaction. If you or a relative donates blood, this is often done in anticipation of surgery and is not appropriate for emergency situations. It takes many days to process the donated blood. RISKS AND  COMPLICATIONS Although transfusion therapy is very safe and saves many lives, the main dangers of transfusion include:  Getting an infectious disease. Developing a transfusion reaction. This is an allergic reaction to something in the blood you were given. Every precaution is taken to prevent this. The decision to have a blood transfusion has been considered carefully by your caregiver before blood is given. Blood is not given unless the benefits outweigh the risks. AFTER THE TRANSFUSION Right after receiving a blood transfusion, you will usually feel much better and more energetic. This  is especially true if your red blood cells have gotten low (anemic). The transfusion raises the level of the red blood cells which carry oxygen, and this usually causes an energy increase. The nurse administering the transfusion will monitor you carefully for complications. HOME CARE INSTRUCTIONS  No special instructions are needed after a transfusion. You may find your energy is better. Speak with your caregiver about any limitations on activity for underlying diseases you may have. SEEK MEDICAL CARE IF:  Your condition is not improving after your transfusion. You develop redness or irritation at the intravenous (IV) site. SEEK IMMEDIATE MEDICAL CARE IF:  Any of the following symptoms occur over the next 12 hours: Shaking chills. You have a temperature by mouth above 102 F (38.9 C), not controlled by medicine. Chest, back, or muscle pain. People around you feel you are not acting correctly or are confused. Shortness of breath or difficulty breathing. Dizziness and fainting. You get a rash or develop hives. You have a decrease in urine output. Your urine turns a dark color or changes to pink, red, or brown. Any of the following symptoms occur over the next 10 days: You have a temperature by mouth above 102 F (38.9 C), not controlled by medicine. Shortness of breath. Weakness after normal activity. The  white part of the eye turns yellow (jaundice). You have a decrease in the amount of urine or are urinating less often. Your urine turns a dark color or changes to pink, red, or brown. Document Released: 02/15/2000 Document Revised: 05/12/2011 Document Reviewed: 10/04/2007 [facility] Patient Information 2014 [place], [place].  _______________________________________________________________________

## 2023-12-15 ENCOUNTER — Encounter (HOSPITAL_COMMUNITY): Payer: Self-pay

## 2023-12-15 ENCOUNTER — Other Ambulatory Visit: Payer: Self-pay

## 2023-12-15 ENCOUNTER — Encounter (HOSPITAL_COMMUNITY)
Admission: RE | Admit: 2023-12-15 | Discharge: 2023-12-15 | Disposition: A | Discharge status: Home / Self Care | Source: Ambulatory Visit | Attending: Urology | Admitting: Urology

## 2023-12-15 DIAGNOSIS — Z01812 Encounter for preprocedural laboratory examination: Secondary | ICD-10-CM | POA: Diagnosis not present

## 2023-12-15 DIAGNOSIS — Z1389 Encounter for screening for other disorder: Secondary | ICD-10-CM | POA: Diagnosis not present

## 2023-12-15 DIAGNOSIS — Z01818 Encounter for other preprocedural examination: Secondary | ICD-10-CM | POA: Diagnosis present

## 2023-12-15 HISTORY — DX: Gastro-esophageal reflux disease without esophagitis: K21.9

## 2023-12-15 HISTORY — DX: Unspecified osteoarthritis, unspecified site: M19.90

## 2023-12-15 LAB — BASIC METABOLIC PANEL WITH GFR
Anion gap: 11 (ref 5–15)
BUN: 13 mg/dL (ref 8–23)
CO2: 26 mmol/L (ref 22–32)
Calcium: 9.5 mg/dL (ref 8.9–10.3)
Chloride: 104 mmol/L (ref 98–111)
Creatinine, Ser: 0.93 mg/dL (ref 0.44–1.00)
GFR, Estimated: >60 mL/min (ref >60)
Glucose, Bld: 88 mg/dL (ref 70–99)
Potassium: 4 mmol/L (ref 3.5–5.1)
Sodium: 140 mmol/L (ref 135–145)

## 2023-12-15 LAB — CBC
HCT: 46.5 % — ABNORMAL HIGH (ref 36.0–46.0)
Hemoglobin: 14.4 g/dL (ref 12.0–15.0)
MCH: 29.1 pg (ref 26.0–34.0)
MCHC: 31 g/dL (ref 30.0–36.0)
MCV: 94.1 fL (ref 80.0–100.0)
Platelets: 341 K/uL (ref 150–400)
RBC: 4.94 MIL/uL (ref 3.87–5.11)
RDW: 11.9 % (ref 11.5–15.5)
WBC: 5.3 K/uL (ref 4.0–10.5)
nRBC: 0 % (ref 0.0–0.2)

## 2023-12-16 LAB — URINE CULTURE

## 2023-12-22 NOTE — Anesthesia Preprocedure Evaluation (Signed)
 Anesthesia Evaluation  Patient identified by MRN, date of birth, ID band Patient awake    Reviewed: Allergy & Precautions, NPO status , Patient's Chart, lab work & pertinent test results  History of Anesthesia Complications (+) PONV and history of anesthetic complications  Airway Mallampati: III  TM Distance: >3 FB Neck ROM: Full    Dental  (+) Dental Advisory Given   Pulmonary neg pulmonary ROS   Pulmonary exam normal breath sounds clear to auscultation       Cardiovascular negative cardio ROS  Rhythm:Regular Rate:Normal     Neuro/Psych neg Seizures Vertigo     GI/Hepatic Neg liver ROS,GERD  Medicated,,  Endo/Other  negative endocrine ROS    Renal/GU negative Renal ROS   Pelvic organ prolapse    Musculoskeletal  (+) Arthritis ,    Abdominal   Peds  Hematology negative hematology ROS (+) Lab Results      Component                Value               Date                      WBC                      5.3                 12/15/2023                HGB                      14.4                12/15/2023                HCT                      46.5 (H)            12/15/2023                MCV                      94.1                12/15/2023                PLT                      341                 12/15/2023              Anesthesia Other Findings   Reproductive/Obstetrics                              Anesthesia Physical Anesthesia Plan  ASA: 2  Anesthesia Plan: General   Post-op Pain Management: Tylenol PO (pre-op)*   Induction: Intravenous  PONV Risk Score and Plan: 4 or greater and Ondansetron, Dexamethasone, Propofol infusion and Treatment may vary due to age or medical condition  Airway Management Planned: Oral ETT  Additional Equipment:   Intra-op Plan:   Post-operative Plan: Extubation in OR  Informed Consent: I have reviewed the patients History and Physical,  chart, labs and discussed the procedure including the  risks, benefits and alternatives for the proposed anesthesia with the patient or authorized representative who has indicated his/her understanding and acceptance.     Dental advisory given  Plan Discussed with: CRNA and Anesthesiologist  Anesthesia Plan Comments: (Risks of general anesthesia discussed including, but not limited to, sore throat, hoarse voice, chipped/damaged teeth, injury to vocal cords, nausea and vomiting, allergic reactions, lung infection, heart attack, stroke, and death. All questions answered. )         Anesthesia Quick Evaluation

## 2023-12-23 ENCOUNTER — Encounter (HOSPITAL_COMMUNITY)
Admission: RE | Disposition: A | Discharge status: Home / Self Care | Payer: Self-pay | Source: Home / Self Care | Attending: Urology

## 2023-12-23 ENCOUNTER — Ambulatory Visit (HOSPITAL_COMMUNITY): Payer: Self-pay | Admitting: Physician Assistant

## 2023-12-23 ENCOUNTER — Ambulatory Visit (HOSPITAL_BASED_OUTPATIENT_CLINIC_OR_DEPARTMENT_OTHER): Payer: Self-pay | Admitting: Anesthesiology

## 2023-12-23 ENCOUNTER — Observation Stay (HOSPITAL_COMMUNITY)
Admission: RE | Admit: 2023-12-23 | Discharge: 2023-12-24 | Disposition: A | Discharge status: Home / Self Care | Attending: Urology | Admitting: Urology

## 2023-12-23 ENCOUNTER — Other Ambulatory Visit: Payer: Self-pay

## 2023-12-23 ENCOUNTER — Encounter (HOSPITAL_COMMUNITY): Payer: Self-pay | Admitting: Urology

## 2023-12-23 DIAGNOSIS — N8111 Cystocele, midline: Principal | ICD-10-CM | POA: Insufficient documentation

## 2023-12-23 DIAGNOSIS — K219 Gastro-esophageal reflux disease without esophagitis: Secondary | ICD-10-CM | POA: Diagnosis not present

## 2023-12-23 DIAGNOSIS — M1990 Unspecified osteoarthritis, unspecified site: Secondary | ICD-10-CM | POA: Diagnosis not present

## 2023-12-23 DIAGNOSIS — Z79899 Other long term (current) drug therapy: Secondary | ICD-10-CM | POA: Diagnosis not present

## 2023-12-23 DIAGNOSIS — N814 Uterovaginal prolapse, unspecified: Secondary | ICD-10-CM | POA: Diagnosis not present

## 2023-12-23 DIAGNOSIS — N819 Female genital prolapse, unspecified: Secondary | ICD-10-CM | POA: Diagnosis not present

## 2023-12-23 HISTORY — PX: ROBOTIC ASSISTED LAPAROSCOPIC SACROCOLPOPEXY: SHX5388

## 2023-12-23 LAB — TYPE AND SCREEN
ABO/RH(D): O POS
Antibody Screen: NEGATIVE

## 2023-12-23 LAB — HEMOGLOBIN AND HEMATOCRIT, BLOOD
HCT: 33.1 % — ABNORMAL LOW (ref 36.0–46.0)
Hemoglobin: 10.3 g/dL — ABNORMAL LOW (ref 12.0–15.0)

## 2023-12-23 LAB — ABO/RH: ABO/RH(D): O POS

## 2023-12-23 SURGERY — SACROCOLPOPEXY, ROBOT-ASSISTED, LAPAROSCOPIC
Anesthesia: General

## 2023-12-23 MED ORDER — SUGAMMADEX SODIUM 200 MG/2ML IV SOLN
INTRAVENOUS | Status: AC
  Filled 2023-12-23: qty 2

## 2023-12-23 MED ORDER — HYDROMORPHONE HCL 1 MG/ML IJ SOLN: 0.5000 mg | INTRAMUSCULAR | Status: DC | PRN

## 2023-12-23 MED ORDER — CIPROFLOXACIN IN D5W 400 MG/200ML IV SOLN
INTRAVENOUS | Status: DC | PRN
  Administered 2023-12-23: 400 mg via INTRAVENOUS

## 2023-12-23 MED ORDER — FENTANYL CITRATE (PF) 100 MCG/2ML IJ SOLN
INTRAMUSCULAR | Status: AC
  Filled 2023-12-23: qty 2

## 2023-12-23 MED ORDER — BUPIVACAINE-EPINEPHRINE (PF) 0.5% -1:200000 IJ SOLN
INTRAMUSCULAR | Status: AC
  Filled 2023-12-23: qty 30

## 2023-12-23 MED ORDER — DEXAMETHASONE SOD PHOSPHATE PF 10 MG/ML IJ SOLN
INTRAMUSCULAR | Status: DC | PRN
Start: 2023-12-23 — End: 2023-12-23
  Administered 2023-12-23: 10 mg via INTRAVENOUS

## 2023-12-23 MED ORDER — LIDOCAINE HCL (PF) 2 % IJ SOLN
INTRAMUSCULAR | Status: DC | PRN
  Administered 2023-12-23: 50 mg via INTRADERMAL

## 2023-12-23 MED ORDER — CEPHALEXIN 500 MG PO CAPS: 500.0000 mg | ORAL_CAPSULE | Freq: Two times a day (BID) | ORAL | 0 refills | Status: AC

## 2023-12-23 MED ORDER — ALBUMIN HUMAN 5 % IV SOLN
INTRAVENOUS | Status: AC
  Filled 2023-12-23: qty 250

## 2023-12-23 MED ORDER — PHENYLEPHRINE HCL (PRESSORS) 10 MG/ML IV SOLN
INTRAVENOUS | Status: AC
  Filled 2023-12-23: qty 1

## 2023-12-23 MED ORDER — ALUM & MAG HYDROXIDE-SIMETH 200-200-20 MG/5ML PO SUSP
15.0000 mL | ORAL | Status: DC | PRN
  Administered 2023-12-23: 30 mL via ORAL
  Filled 2023-12-23: qty 30

## 2023-12-23 MED ORDER — PHENYLEPHRINE 80 MCG/ML (10ML) SYRINGE FOR IV PUSH (FOR BLOOD PRESSURE SUPPORT)
PREFILLED_SYRINGE | INTRAVENOUS | Status: AC
  Filled 2023-12-23: qty 10

## 2023-12-23 MED ORDER — STERILE WATER FOR IRRIGATION IR SOLN
Status: DC | PRN
  Administered 2023-12-23: 1000 mL

## 2023-12-23 MED ORDER — ACETAMINOPHEN 10 MG/ML IV SOLN
1000.0000 mg | Freq: Four times a day (QID) | INTRAVENOUS | Status: DC
  Administered 2023-12-23 – 2023-12-24 (×3): 1000 mg via INTRAVENOUS
  Filled 2023-12-23 (×3): qty 100

## 2023-12-23 MED ORDER — ONDANSETRON HCL 4 MG/2ML IJ SOLN
INTRAMUSCULAR | Status: DC | PRN
  Administered 2023-12-23: 4 mg via INTRAVENOUS

## 2023-12-23 MED ORDER — TRAMADOL HCL 50 MG PO TABS: 50.0000 mg | ORAL_TABLET | Freq: Four times a day (QID) | ORAL | Status: DC | PRN

## 2023-12-23 MED ORDER — SODIUM CHLORIDE 0.45 % IV SOLN
INTRAVENOUS | Status: DC
Start: 2023-12-23 — End: 2023-12-24

## 2023-12-23 MED ORDER — CEFAZOLIN SODIUM-DEXTROSE 2-4 GM/100ML-% IV SOLN
2.0000 g | INTRAVENOUS | Status: AC
  Administered 2023-12-23: 2 g via INTRAVENOUS
  Filled 2023-12-23: qty 100

## 2023-12-23 MED ORDER — DOCUSATE SODIUM 100 MG PO CAPS
100.0000 mg | ORAL_CAPSULE | Freq: Two times a day (BID) | ORAL | Status: DC
  Administered 2023-12-23: 100 mg via ORAL
  Filled 2023-12-23: qty 1

## 2023-12-23 MED ORDER — ALBUTEROL SULFATE HFA 108 (90 BASE) MCG/ACT IN AERS
INHALATION_SPRAY | RESPIRATORY_TRACT | Status: AC
  Filled 2023-12-23: qty 6.7

## 2023-12-23 MED ORDER — DIPHENHYDRAMINE HCL 50 MG/ML IJ SOLN: 12.5000 mg | Freq: Four times a day (QID) | INTRAMUSCULAR | Status: DC | PRN

## 2023-12-23 MED ORDER — LACTATED RINGERS IV SOLN: INTRAVENOUS | Status: DC

## 2023-12-23 MED ORDER — ROCURONIUM BROMIDE 10 MG/ML (PF) SYRINGE
PREFILLED_SYRINGE | INTRAVENOUS | Status: AC
  Filled 2023-12-23: qty 10

## 2023-12-23 MED ORDER — ONDANSETRON HCL 4 MG/2ML IJ SOLN
INTRAMUSCULAR | Status: AC
  Filled 2023-12-23: qty 2

## 2023-12-23 MED ORDER — PROPOFOL 10 MG/ML IV BOLUS
INTRAVENOUS | Status: AC
  Filled 2023-12-23: qty 20

## 2023-12-23 MED ORDER — AMISULPRIDE (ANTIEMETIC) 5 MG/2ML IV SOLN: 10.0000 mg | Freq: Once | INTRAVENOUS | Status: DC | PRN

## 2023-12-23 MED ORDER — OXYCODONE HCL 5 MG PO TABS: 5.0000 mg | ORAL_TABLET | Freq: Once | ORAL | Status: DC | PRN

## 2023-12-23 MED ORDER — CIPROFLOXACIN IN D5W 400 MG/200ML IV SOLN
400.0000 mg | INTRAVENOUS | Status: DC
  Filled 2023-12-23: qty 200

## 2023-12-23 MED ORDER — TRAMADOL HCL 50 MG PO TABS
50.0000 mg | ORAL_TABLET | Freq: Four times a day (QID) | ORAL | 0 refills | Status: AC | PRN
Start: 2023-12-23 — End: ?

## 2023-12-23 MED ORDER — MIDAZOLAM HCL (PF) 2 MG/2ML IJ SOLN
INTRAMUSCULAR | Status: DC | PRN
Start: 2023-12-23 — End: 2023-12-23
  Administered 2023-12-23 (×2): 1 mg via INTRAVENOUS

## 2023-12-23 MED ORDER — FENTANYL CITRATE (PF) 50 MCG/ML IJ SOSY: 25.0000 ug | PREFILLED_SYRINGE | INTRAMUSCULAR | Status: DC | PRN

## 2023-12-23 MED ORDER — ESTRADIOL 0.01 % VA CREA
TOPICAL_CREAM | VAGINAL | Status: DC | PRN
  Administered 2023-12-23: 1 via VAGINAL

## 2023-12-23 MED ORDER — BUPIVACAINE-EPINEPHRINE 0.5% -1:200000 IJ SOLN
INTRAMUSCULAR | Status: DC | PRN
  Administered 2023-12-23: 30 mL

## 2023-12-23 MED ORDER — HYOSCYAMINE SULFATE 0.125 MG SL SUBL: 0.1250 mg | SUBLINGUAL_TABLET | SUBLINGUAL | Status: DC | PRN

## 2023-12-23 MED ORDER — ALBUTEROL SULFATE HFA 108 (90 BASE) MCG/ACT IN AERS
INHALATION_SPRAY | RESPIRATORY_TRACT | Status: DC | PRN
  Administered 2023-12-23: 2 via RESPIRATORY_TRACT

## 2023-12-23 MED ORDER — SUGAMMADEX SODIUM 200 MG/2ML IV SOLN
INTRAVENOUS | Status: DC | PRN
  Administered 2023-12-23: 200 mg via INTRAVENOUS

## 2023-12-23 MED ORDER — POLYETHYLENE GLYCOL 3350 17 GM/SCOOP PO POWD: 238.0000 g | Freq: Once | ORAL | Status: DC

## 2023-12-23 MED ORDER — FENTANYL CITRATE (PF) 100 MCG/2ML IJ SOLN
INTRAMUSCULAR | Status: DC | PRN
  Administered 2023-12-23: 50 ug via INTRAVENOUS

## 2023-12-23 MED ORDER — PROPOFOL 1000 MG/100ML IV EMUL
INTRAVENOUS | Status: AC
  Filled 2023-12-23: qty 100

## 2023-12-23 MED ORDER — ROCURONIUM BROMIDE 10 MG/ML (PF) SYRINGE
PREFILLED_SYRINGE | INTRAVENOUS | Status: DC | PRN
  Administered 2023-12-23: 20 mg via INTRAVENOUS
  Administered 2023-12-23: 70 mg via INTRAVENOUS

## 2023-12-23 MED ORDER — DOCUSATE SODIUM 100 MG PO CAPS: 100.0000 mg | ORAL_CAPSULE | Freq: Two times a day (BID) | ORAL | Status: AC

## 2023-12-23 MED ORDER — PHENYLEPHRINE HCL-NACL 20-0.9 MG/250ML-% IV SOLN
INTRAVENOUS | Status: DC | PRN
  Administered 2023-12-23: 50 ug/min via INTRAVENOUS

## 2023-12-23 MED ORDER — ORAL CARE MOUTH RINSE: 15.0000 mL | Freq: Once | OROMUCOSAL | Status: AC

## 2023-12-23 MED ORDER — PROPOFOL 10 MG/ML IV BOLUS
INTRAVENOUS | Status: DC | PRN
  Administered 2023-12-23: 160 mg via INTRAVENOUS
  Administered 2023-12-23: 30 ug/kg/min via INTRAVENOUS
  Administered 2023-12-23: 50 mg via INTRAVENOUS

## 2023-12-23 MED ORDER — ACETAMINOPHEN 500 MG PO TABS
1000.0000 mg | ORAL_TABLET | Freq: Once | ORAL | Status: AC
  Administered 2023-12-23: 1000 mg via ORAL
  Filled 2023-12-23: qty 2

## 2023-12-23 MED ORDER — PANTOPRAZOLE SODIUM 40 MG PO TBEC
40.0000 mg | DELAYED_RELEASE_TABLET | Freq: Every day | ORAL | Status: DC
  Administered 2023-12-23 – 2023-12-24 (×2): 40 mg via ORAL
  Filled 2023-12-23 (×2): qty 1

## 2023-12-23 MED ORDER — ORAL CARE MOUTH RINSE: 15.0000 mL | OROMUCOSAL | Status: DC | PRN

## 2023-12-23 MED ORDER — OXYCODONE HCL 5 MG/5ML PO SOLN: 5.0000 mg | Freq: Once | ORAL | Status: DC | PRN

## 2023-12-23 MED ORDER — LIDOCAINE HCL (PF) 2 % IJ SOLN
INTRAMUSCULAR | Status: AC
  Filled 2023-12-23: qty 5

## 2023-12-23 MED ORDER — ALBUMIN HUMAN 5 % IV SOLN: INTRAVENOUS | Status: DC | PRN

## 2023-12-23 MED ORDER — CHLORHEXIDINE GLUCONATE 0.12 % MT SOLN
15.0000 mL | Freq: Once | OROMUCOSAL | Status: AC
  Administered 2023-12-23: 15 mL via OROMUCOSAL

## 2023-12-23 MED ORDER — MIDAZOLAM HCL 2 MG/2ML IJ SOLN
INTRAMUSCULAR | Status: AC
  Filled 2023-12-23: qty 2

## 2023-12-23 MED ORDER — LACTATED RINGERS IV SOLN: INTRAVENOUS | Status: DC | PRN

## 2023-12-23 MED ORDER — DIPHENHYDRAMINE HCL 12.5 MG/5ML PO ELIX: 12.5000 mg | ORAL_SOLUTION | Freq: Four times a day (QID) | ORAL | Status: DC | PRN

## 2023-12-23 MED ORDER — ESTRADIOL 0.01 % VA CREA
TOPICAL_CREAM | VAGINAL | Status: AC
  Filled 2023-12-23: qty 42.5

## 2023-12-23 MED ORDER — BUPIVACAINE LIPOSOME 1.3 % IJ SUSP
INTRAMUSCULAR | Status: AC
  Filled 2023-12-23: qty 20

## 2023-12-23 MED ORDER — BUPIVACAINE LIPOSOME 1.3 % IJ SUSP
INTRAMUSCULAR | Status: DC | PRN
  Administered 2023-12-23: 20 mL

## 2023-12-23 MED ORDER — ONDANSETRON HCL 4 MG/2ML IJ SOLN: 4.0000 mg | INTRAMUSCULAR | Status: DC | PRN

## 2023-12-23 SURGICAL SUPPLY — 70 items
BAG COUNTER SPONGE SURGICOUNT (BAG) IMPLANT
BAG URINE DRAIN 2000ML AR STRL (UROLOGICAL SUPPLIES) IMPLANT
BRIEF MESH DISP LRG (UNDERPADS AND DIAPERS) IMPLANT
CATH FOLEY 2WAY SLVR 5CC 16FR (CATHETERS) ×1 IMPLANT
CHLORAPREP W/TINT 26 (MISCELLANEOUS) ×1 IMPLANT
CLIP LIGATING HEM O LOK PURPLE (MISCELLANEOUS) ×1 IMPLANT
CLIP LIGATING HEMO LOK XL GOLD (MISCELLANEOUS) IMPLANT
CLIP LIGATING HEMO O LOK GREEN (MISCELLANEOUS) IMPLANT
CLIP SUT LAPRA TY ABSORB (SUTURE) IMPLANT
COVER BACK TABLE 60X90IN (DRAPES) ×1 IMPLANT
COVER SURGICAL LIGHT HANDLE (MISCELLANEOUS) ×1 IMPLANT
COVER TIP SHEARS 8 DVNC (MISCELLANEOUS) ×1 IMPLANT
DERMABOND ADVANCED .7 DNX12 (GAUZE/BANDAGES/DRESSINGS) ×1 IMPLANT
DRAPE ARM DVNC X/XI (DISPOSABLE) ×4 IMPLANT
DRAPE COLUMN DVNC XI (DISPOSABLE) ×1 IMPLANT
DRAPE INCISE IOBAN 66X45 STRL (DRAPES) ×1 IMPLANT
DRAPE LAPAROSCOPIC ABDOMINAL (DRAPES) ×1 IMPLANT
DRAPE SHEET LG 3/4 BI-LAMINATE (DRAPES) ×2 IMPLANT
DRAPE SURG IRRIG POUCH 19X23 (DRAPES) ×1 IMPLANT
DRIVER NDL LRG 8 DVNC XI (INSTRUMENTS) ×2 IMPLANT
DRIVER NDLE LRG 8 DVNC XI (INSTRUMENTS) ×2 IMPLANT
ELECT PENCIL ROCKER SW 15FT (MISCELLANEOUS) ×1 IMPLANT
ELECT REM PT RETURN 15FT ADLT (MISCELLANEOUS) ×1 IMPLANT
FORCEPS BPLR LNG DVNC XI (INSTRUMENTS) ×1 IMPLANT
FORCEPS PROGRASP DVNC XI (FORCEP) ×1 IMPLANT
FORCEPS TENACULUM DVNC XI (FORCEP) ×1 IMPLANT
GAUZE 4X4 16PLY ~~LOC~~+RFID DBL (SPONGE) IMPLANT
GLOVE BIO SURGEON STRL SZ 6.5 (GLOVE) ×1 IMPLANT
GLOVE SURG LX STRL 7.5 STRW (GLOVE) ×2 IMPLANT
GOWN STRL REUS W/ TWL XL LVL3 (GOWN DISPOSABLE) ×2 IMPLANT
GOWN STRL SURGICAL XL XLNG (GOWN DISPOSABLE) ×1 IMPLANT
HEMOSTAT SURGICEL 4X8 (HEMOSTASIS) IMPLANT
HOLDER FOLEY CATH W/STRAP (MISCELLANEOUS) ×1 IMPLANT
IRRIGATION SUCT STRKRFLW 2 WTP (MISCELLANEOUS) ×1 IMPLANT
KIT BASIN OR (CUSTOM PROCEDURE TRAY) ×1 IMPLANT
KIT TURNOVER KIT A (KITS) ×1 IMPLANT
MANIPULATOR UTERINE 4.5 ZUMI (MISCELLANEOUS) IMPLANT
MARKER SKIN DUAL TIP RULER LAB (MISCELLANEOUS) ×1 IMPLANT
MESH Y UPSYLON VAGINAL (Mesh General) IMPLANT
OCCLUDER COLPOPNEUMO (BALLOONS) IMPLANT
PACKING VAGINAL (PACKING) IMPLANT
PAD OB MATERNITY 11 LF (PERSONAL CARE ITEMS) IMPLANT
PAD POSITIONING PINK XL (MISCELLANEOUS) ×1 IMPLANT
SCISSORS LAP 5X45 EPIX DISP (ENDOMECHANICALS) IMPLANT
SCISSORS MNPLR CVD DVNC XI (INSTRUMENTS) ×1 IMPLANT
SCRUB CHG 4% DYNA-HEX 4OZ (MISCELLANEOUS) IMPLANT
SEAL UNIV 5-12 XI (MISCELLANEOUS) ×4 IMPLANT
SET IRRIG Y TYPE TUR BLADDER L (SET/KITS/TRAYS/PACK) IMPLANT
SET TUBE SMOKE EVAC HIGH FLOW (TUBING) ×1 IMPLANT
SHEET LAVH (DRAPES) ×1 IMPLANT
SOL PREP POV-IOD 4OZ 10% (MISCELLANEOUS) IMPLANT
SOLUTION ELECTROSURG ANTI STCK (MISCELLANEOUS) ×1 IMPLANT
SPONGE T-LAP 4X18 ~~LOC~~+RFID (SPONGE) IMPLANT
SURGILUBE 2OZ TUBE FLIPTOP (MISCELLANEOUS) IMPLANT
SUT MNCRL AB 4-0 PS2 18 (SUTURE) ×2 IMPLANT
SUT MON AB 3-0 SH27 (SUTURE) IMPLANT
SUT PROLENE 2 0 CT 1 (SUTURE) ×1 IMPLANT
SUT STRATAFIX SPIRAL PDS3-0 (SUTURE) IMPLANT
SUT VIC AB 0 CT1 27XBRD ANTBC (SUTURE) ×1 IMPLANT
SUT VIC AB 2-0 SH 27XBRD (SUTURE) ×5 IMPLANT
SUT VIC AB 3-0 SH 27X BRD (SUTURE) ×1 IMPLANT
SUT VICRYL 0 UR6 27IN ABS (SUTURE) ×1 IMPLANT
SUT VLOC 180 3-0 9IN GS21 (SUTURE) IMPLANT
SYR 50ML LL SCALE MARK (SYRINGE) IMPLANT
SYSTEM BAG RETRIEVAL 10MM (BASKET) IMPLANT
TOWEL OR 17X26 10 PK STRL BLUE (TOWEL DISPOSABLE) ×1 IMPLANT
TRAY LAPAROSCOPIC (CUSTOM PROCEDURE TRAY) ×1 IMPLANT
TROCAR Z THREAD OPTICAL 12X100 (TROCAR) ×1 IMPLANT
TROCAR Z-THREAD FIOS 5X100MM (TROCAR) ×1 IMPLANT
WATER STERILE IRR 1000ML POUR (IV SOLUTION) ×1 IMPLANT

## 2023-12-23 NOTE — Transfer of Care (Signed)
 Immediate Anesthesia Transfer of Care Note  Patient: Mandy Guzman  Procedure(s) Performed: SACROCOLPOPEXY, ROBOT-ASSISTED, LAPAROSCOPIC  Patient Location: PACU  Anesthesia Type:General  Level of Consciousness: sedated and responds to stimulation  Airway & Oxygen Therapy: Patient Spontanous Breathing and Patient connected to face mask oxygen  Post-op Assessment: Report given to RN and Post -op Vital signs reviewed and unstable, Anesthesiologist notified  Post vital signs: Reviewed and stable  Last Vitals:  Vitals Value Taken Time  BP 120/66 12/23/23 12:00  Temp    Pulse 59 12/23/23 12:02  Resp 15 12/23/23 12:02  SpO2 97 % 12/23/23 12:02  Vitals shown include unfiled device data.  Last Pain:  Vitals:   12/23/23 0552  TempSrc:   PainSc: 0-No pain      Patients Stated Pain Goal: 4 (12/23/23 0552)  Complications: No notable events documented.

## 2023-12-23 NOTE — Interval H&P Note (Signed)
 History and Physical Interval Note:  12/23/2023 7:37 AM  Mandy Guzman  has presented today for surgery, with the diagnosis of PELVIC ORGAN PROLAPSE.  The various methods of treatment have been discussed with the patient and family. After consideration of risks, benefits and other options for treatment, the patient has consented to  Procedure(s) with comments: SACROCOLPOPEXY, ROBOT-ASSISTED, LAPAROSCOPIC (N/A) - ROBOTIC SACROCOLPOPEXY as a surgical intervention.  The patient's history has been reviewed, patient examined, no change in status, stable for surgery.  I have reviewed the patient's chart and labs.  Questions were answered to the patient's satisfaction.     Mandy Guzman

## 2023-12-23 NOTE — Plan of Care (Signed)

## 2023-12-23 NOTE — Discharge Instructions (Signed)

## 2023-12-23 NOTE — Anesthesia Postprocedure Evaluation (Signed)
 Anesthesia Post Note  Patient: Mandy Guzman  Procedure(s) Performed: SACROCOLPOPEXY, ROBOT-ASSISTED, LAPAROSCOPIC     Patient location during evaluation: PACU Anesthesia Type: General Level of consciousness: awake Pain management: pain level controlled Vital Signs Assessment: post-procedure vital signs reviewed and stable Respiratory status: spontaneous breathing, nonlabored ventilation and respiratory function stable Cardiovascular status: blood pressure returned to baseline and stable Postop Assessment: no apparent nausea or vomiting Anesthetic complications: no   No notable events documented.  Last Vitals:  Vitals:   12/23/23 1315 12/23/23 1330  BP: 110/75 (!) 105/41  Pulse: 76 73  Resp: 19 19  Temp:    SpO2: 99% 99%    Last Pain:  Vitals:   12/23/23 1330  TempSrc:   PainSc: 0-No pain                 [name]

## 2023-12-23 NOTE — Anesthesia Procedure Notes (Signed)
 Procedure Name: Intubation Date/Time: 12/23/2023 7:58 AM  Performed by: Obadiah Reyes BROCKS, CRNAPre-anesthesia Checklist: Patient identified, Emergency Drugs available, Suction available and Patient being monitored Patient Re-evaluated:Patient Re-evaluated prior to induction Oxygen Delivery Method: Circle System Utilized Preoxygenation: Pre-oxygenation with 100% oxygen Induction Type: IV induction Ventilation: Mask ventilation without difficulty Laryngoscope Size: Miller and 2 Grade View: Grade II Tube type: Oral Tube size: 7.0 mm Number of attempts: 1 Airway Equipment and Method: Stylet and Oral airway Placement Confirmation: ETT inserted through vocal cords under direct vision, positive ETCO2 and breath sounds checked- equal and bilateral Secured at: 21 cm Tube secured with: Tape Dental Injury: Teeth and Oropharynx as per pre-operative assessment

## 2023-12-23 NOTE — Op Note (Addendum)
 Preoperative diagnosis:  Pelvic organ prolapse   Postoperative diagnosis:  same   Procedure: Robotic assisted laparoscopic sacrocolpopexy  Surgeon: Morene MICAEL Salines, MD 1st assistant: Alan Hammonds, PA-C Resident MD: Lyle Civil, MD  An assistant was required for this surgical procedure.  The duties of the assistant included but were not limited to suctioning, passing suture, camera manipulation, retraction. This procedure would not be able to be performed without an Geophysicist/field seismologist.   Anesthesia: General  Complications: None  Intraoperative findings: [employer] scientific Upsilon Y-Mesh placed, cut to specific vaginal length. Mesenteric bleeding/oozing resulting in increased EBL.  EBL: 800cc  Specimens: None  Indication: PRANATHI WINFREE is a 73 y.o. female patient with symptomatic pelvic organ prolapse.  After reviewing the management options for treatment, he elected to proceed with the above surgical procedure(s). We have discussed the potential benefits and risks of the procedure, side effects of the proposed treatment, the likelihood of the patient achieving the goals of the procedure, and any potential problems that might occur during the procedure or recuperation. Informed consent has been obtained.  Description of procedure:  A Foley catheter was then placed and placed to gravity drainage. I then made a periumbilical incision carrying the dissection down to the patient's fascia with electrocautery.  Once to the fascia, the fascia was incised the peritoneum opened.  A 8mm port was then placed into the abdomen.  The abdomen was insufflated and the remaining ports placed under digital guidance.  2 ports were placed lateral to the umbilicus on the right proximally 10 cm apart.  The most lateral port was approximately 3 cm above the anterior iliac spine.  2 additional ports were placed in the patient's right side in comparable positions to the most lateral port on the right was a 12 mm  port.the robot was then docked at an angle from the leg obliquely along the side of the left leg.  We then began our surgery by cleaning up some of the pelvic adhesions to the small bowel and colon.  Once this was completed I started dissecting at the sacral promontory located 3 cm medial to the location where the ureter crosses over the iliac vessels at the pelvic brim. The posterior peritoneum was incised and the sacral prominence cleared off an area taking care to avoid the middle sacral vessels and the iliac branches.  I then created a posterior peritoneal tunnel starting at the sacral promontory and tunneling down the right pelvic sidewall down into the pelvis breaking back through the posterior peritoneum around the vesico-vaginal junction posteriorly.  I then continued the posterior dissection retracting down on the rectum and finding the avascular plane between the posterior vaginal wall and the rectum.  I carried this dissection down as far as I could to along the area of the perineal body.  I then turned my attention to the anterior plane between the anterior vaginal wall and the bladder.  I was able to obtain access to the avascular plane and with a combination of both monopolar cautery and blunt dissection was able to clean and nice down to the bladder neck.  I then turned my attention back to the patient's uterus and skeletonized the right uterine artery and vein and then took this with a series of bipolar moves.  I then performed a similar uterus pedicle ligation on the left.    Mesh was measured at approximately 8.5 cm anteriorly and 10 cm posteriorly and I cut this on the back table.  The mesh  was then placed into the patient's abdomen through the assistant port and the anterior leaf was secured down onto the anterior vaginal wall with the apex at the bladder neck.  The posterior leaf was then secured down on the posterior vaginal wall.  These were sewn down with 2-0 Vicryl.  Between 6 and 8 were  done on each side.  At this point I then went back to the previously dissected sacral promontory and posterior peritoneal tunnel and inserted a instrument through the tunnel and grasped the end of the mesh at the vaginal cuff and pull it up to the sacrum.  I then checked to ensure that the sacral mesh was not too tight by performing a vaginal exam.  I then secured the sacral leg of the mesh using a 0 Prolene.  I then reapproximated the posterior peritoneum with a 2-0 Vicryl in a running fashion around the sacral promontory.  The pelvic peritoneum was closed using a pursestring.   The fascia of the 12 mm port was then closed with 0 Vicryl in a figure-of-eight fashion.  The skin was closed with 4-0 Monocryl's.  Dermabond was applied to the incision and exparel injected into the incisions.  Estrace impregnated packing was then placed into her vagina which will be left in overnight.  The patient was subsequently extubated and returned to the PACU in excellent condition.   Morene MICAEL Salines, M.D.

## 2023-12-23 NOTE — Progress Notes (Signed)
 Post-op note  Subjective: The patient is doing well.  No complaints.  Objective: Vital signs in last 24 hours: Temp:  [97.2 F (36.2 C)-98.1 F (36.7 C)] 97.2 F (36.2 C) (10/22 1200) Pulse Rate:  [60-85] 73 (10/22 1330) Resp:  [16-20] 19 (10/22 1330) BP: (105-131)/(41-95) 105/41 (10/22 1330) SpO2:  [95 %-100 %] 99 % (10/22 1330) Weight:  [58.5 kg] 58.5 kg (10/22 0552)  Intake/Output from previous day: No intake/output data recorded. Intake/Output this shift: Total I/O In: 3000 [I.V.:2500; IV Piggyback:500] Out: 900 [Urine:100; Blood:800]  Physical Exam:  General: Alert and oriented. Abdomen: Soft, Nondistended. Incisions: Clean and dry.  Lab Results: Recent Labs    12/23/23 1215  HGB 10.3*  HCT 33.1*    Assessment/Plan: POD#0   1) Continue to monitor    LOS: 0 days   Lyle LOISE Civil 12/23/2023, 2:57 PM

## 2023-12-23 NOTE — H&P (Signed)
 cc: Cystocele   HPI: 73 year old female who presents today for pessary fitting. She has a pessary from her gynecologist which she has never been able to use. She does not remember her getting fitted. She has a grade 3 cystocele. She states it is very uncomfortable. She does not leak with laugh, cough or sneeze. She does not have much urgency. She has had a hysterectomy. She does use vaginal Estrace cream.   Interval: The patient has failed the pessary fitting. She is here today for further discussion of having a sacrocolpopexy. She has done some research, she knows some about the procedure. She is anxious to proceed. She has no significant voiding symptoms. She just has pain.   UDS: The patient has a normal bladder capacity with no evidence of overactive bladder or any incontinence.   The patient has a history of inguinal hernia repair, had mesh placed laparoscopically in 2023. She is also had a complete hysterectomy and a bladder tack in the past.   The patient's otherwise healthy with no history of diabetes or heart disease. She has good exercise tolerance.     ALLERGIES: No Allergies    MEDICATIONS: Diflucan  Align  Amoxicillin   B-12 TABS Oral  Doxycycline  Estradiol 0.1 MG/GM Cream  MiraLax  Omeprazole TBEC Oral  Os-Cal 500 + D TABS Oral  Vitamin D 3     GU PSH: Complex cystometrogram, w/ void pressure and urethral pressure profile studies, any technique - 10/07/2023 Complex Uroflow - 10/07/2023 Emg surf Electrd - 10/07/2023 Hysterectomy Unilat SO - 2011 Inject For cystogram - 10/07/2023 Insert Pessary/other Device - 09/24/2023 Intrabd voidng Press - 10/07/2023       PSH Notes: Tonsillectomy, Hysterectomy, Breast Surgery(reduction)   NON-GU PSH: Breast Surgery Procedure - 2011 Remove Tonsils - 2011 Visit Complexity (formerly GPC1X) - 10/01/2023, 09/24/2023     GU PMH: Cystocele, midline - 10/07/2023, - 10/01/2023, - 09/24/2023, - 08/13/2023, Cystocele, midline, - 2016 Mixed  incontinence - 10/07/2023 Renal cyst, Renal cyst, acquired - 2016      PMH Notes:  2009-03-14 11:19:34 - Note: Arthritis, prolapsed bladder   NON-GU PMH: Encounter for general adult medical examination without abnormal findings, Encounter for preventive health examination - 2016 Anxiety, Anxiety (Symptom) - 2014 Personal history of other diseases of the digestive system, History of esophageal reflux - 2014 Arthritis GERD    FAMILY HISTORY: 2 sons - Runs in Family Alzheimer's Disease - Mother Death In The Family Mother - Runs In Family Heart Disease - Father rheumatoid arthritis - Mother   SOCIAL HISTORY: Marital Status: Married Preferred Language: English; Race: White Current Smoking Status: Patient has never smoked.   Tobacco Use Assessment Completed: Used Tobacco in last 30 days? Has never drank.  Drinks 4+ caffeinated drinks per day. Patient's occupation is/was retired.     Notes: Father alive and healthy, Retired, Mother deceased, Never smoker, Tobacco Use, Alcohol Use, Caffeine Use, Marital History - Currently Married   REVIEW OF SYSTEMS:    GU Review Female:   Patient denies frequent urination, hard to postpone urination, burning /pain with urination, get up at night to urinate, leakage of urine, stream starts and stops, trouble starting your stream, have to strain to urinate, and being pregnant.  Gastrointestinal (Upper):   Patient denies nausea, vomiting, and indigestion/ heartburn.  Gastrointestinal (Lower):   Patient denies diarrhea and constipation.  Constitutional:   Patient denies fever, night sweats, weight loss, and fatigue.  Skin:   Patient denies skin rash/ lesion and  itching.  Eyes:   Patient denies blurred vision and double vision.  Ears/ Nose/ Throat:   Patient denies sore throat and sinus problems.  Hematologic/Lymphatic:   Patient denies swollen glands and easy bruising.  Cardiovascular:   Patient denies leg swelling and chest pains.  Respiratory:    Patient denies cough and shortness of breath.  Endocrine:   Patient denies excessive thirst.  Musculoskeletal:   Patient denies back pain and joint pain.  Neurological:   Patient denies headaches and dizziness.  Psychologic:   Patient denies depression and anxiety.   VITAL SIGNS: None   MULTI-SYSTEM PHYSICAL EXAMINATION:    Constitutional: Well-nourished. No physical deformities. Normally developed. Good grooming.  Respiratory: Normal breath sounds. No labored breathing, no use of accessory muscles.   Cardiovascular: Regular rate and rhythm. No murmur, no gallop. Normal temperature, normal extremity pulses, no swelling, no varicosities.      Complexity of Data:  Source Of History:  Patient  Records Review:   Previous Doctor Records, Previous Patient Records, POC Tool  Urine Test Review:   Urinalysis  Urodynamics Review:   Review Urodynamics Tests   PROCEDURES:          Visit Complexity - G2211    ASSESSMENT:      ICD-10 Details  1 GU:   Cystocele, midline - N81.11    PLAN:           Document Letter(s):  Created for Patient: Clinical Summary         Notes:   I went over robotic-assisted laparoscopic sacral colpopexy with the patient detail. I explained to the patient the rationale for the surgery. I also went over the placement of the laparoscopic ports. I detailed to her the surgery as well as the postoperative recovery time. I explained to the patient that she could expect to be in the hospital at least one or 2 nights. She will require 4 weeks of no heavy lifting, 6 weeks of no bending or twisting. She will not be able to use her vagina for 6 weeks. I discussed complications of the operation including injury to bowel, ureters, bladder. We also discussed the risk of failure as well as the complications of mesh. I explained to them the difference between transvaginal mesh and the mesh used for sacral colpopexy. I reassured them that there has not been an FDA warnings in regards to  sacral colpopexy mesh. We will plan to get this prior to her surgery. I spent 45 minutes with the patient going over that ins and outs of the surgery and answering all her questions.

## 2023-12-24 ENCOUNTER — Encounter (HOSPITAL_COMMUNITY): Payer: Self-pay | Admitting: Urology

## 2023-12-24 DIAGNOSIS — Z79899 Other long term (current) drug therapy: Secondary | ICD-10-CM | POA: Diagnosis not present

## 2023-12-24 DIAGNOSIS — M1990 Unspecified osteoarthritis, unspecified site: Secondary | ICD-10-CM | POA: Diagnosis not present

## 2023-12-24 DIAGNOSIS — K219 Gastro-esophageal reflux disease without esophagitis: Secondary | ICD-10-CM | POA: Diagnosis not present

## 2023-12-24 DIAGNOSIS — N8111 Cystocele, midline: Secondary | ICD-10-CM | POA: Diagnosis not present

## 2023-12-24 LAB — HEMOGLOBIN AND HEMATOCRIT, BLOOD
HCT: 26.6 % — ABNORMAL LOW (ref 36.0–46.0)
HCT: 26.7 % — ABNORMAL LOW (ref 36.0–46.0)
Hemoglobin: 8.7 g/dL — ABNORMAL LOW (ref 12.0–15.0)
Hemoglobin: 8.5 g/dL — ABNORMAL LOW (ref 12.0–15.0)

## 2023-12-24 LAB — BASIC METABOLIC PANEL WITH GFR
Anion gap: 10 (ref 5–15)
BUN: 9 mg/dL (ref 8–23)
CO2: 24 mmol/L (ref 22–32)
Calcium: 8.5 mg/dL — ABNORMAL LOW (ref 8.9–10.3)
Chloride: 106 mmol/L (ref 98–111)
Creatinine, Ser: 0.87 mg/dL (ref 0.44–1.00)
GFR, Estimated: >60 mL/min (ref >60)
Glucose, Bld: 123 mg/dL — ABNORMAL HIGH (ref 70–99)
Potassium: 3.6 mmol/L (ref 3.5–5.1)
Sodium: 141 mmol/L (ref 135–145)

## 2023-12-24 NOTE — Progress Notes (Signed)
 1 Day Post-Op Subjective: The patient is doing well.  No nausea or vomiting. Pain is adequately controlled.  Objective: Vital signs in last 24 hours: Temp:  [97.2 F (36.2 C)-99.3 F (37.4 C)] 98.8 F (37.1 C) (10/23 0741) Pulse Rate:  [60-88] 80 (10/23 0741) Resp:  [16-20] 16 (10/23 0741) BP: (92-120)/(41-95) 102/68 (10/23 0741) SpO2:  [95 %-100 %] 96 % (10/23 0741)  Intake/Output from previous day: 10/22 0701 - 10/23 0700 In: 4962.2 [P.O.:240; I.V.:3939.4; IV Piggyback:782.7] Out: 6724 [Lmpwz:7524; Blood:800] Intake/Output this shift: No intake/output data recorded.  Physical Exam:  General: Alert and oriented. CV: RRR Lungs: Clear bilaterally. GI: Soft, Nondistended. Incisions: Clean and dry. Urine: Clear Extremities: Nontender, no erythema, no edema.  Lab Results: Recent Labs    12/23/23 1215 12/24/23 0409  HGB 10.3* 8.7*  HCT 33.1* 26.6*          Recent Labs    12/24/23 0409  CREATININE 0.87           Results for orders placed or performed during the hospital encounter of 12/23/23 (from the past 24 hours)  Hemoglobin and hematocrit, blood     Status: Abnormal   Collection Time: 12/23/23 12:15 PM  Result Value Ref Range   Hemoglobin 10.3 (L) 12.0 - 15.0 g/dL   HCT 66.8 (L) 63.9 - 53.9 %  Basic metabolic panel     Status: Abnormal   Collection Time: 12/24/23  4:09 AM  Result Value Ref Range   Sodium 141 135 - 145 mmol/L   Potassium 3.6 3.5 - 5.1 mmol/L   Chloride 106 98 - 111 mmol/L   CO2 24 22 - 32 mmol/L   Glucose, Bld 123 (H) 70 - 99 mg/dL   BUN 9 8 - 23 mg/dL   Creatinine, Ser 9.12 0.44 - 1.00 mg/dL   Calcium 8.5 (L) 8.9 - 10.3 mg/dL   GFR, Estimated >39 >39 mL/min   Anion gap 10 5 - 15  Hemoglobin and hematocrit, blood     Status: Abnormal   Collection Time: 12/24/23  4:09 AM  Result Value Ref Range   Hemoglobin 8.7 (L) 12.0 - 15.0 g/dL   HCT 73.3 (L) 63.9 - 53.9 %    Assessment/Plan: POD# 1 s/p robotic sacrocolpopexy   1) Ambulate,  Incentive spirometry 2) Advance diet as tolerated 3) Transition to oral pain medication 4) Bowel regimen  5) D/C urethral catheter 6) Recheck H/H this afternoon  Anticipate discharge today    LOS: 0 days   Lyle LOISE Civil 12/24/2023, 7:51 AM

## 2023-12-24 NOTE — Discharge Summary (Signed)
 Date of admission: 12/23/2023  Date of discharge: 12/24/2023  Admission diagnosis: pelvic organ prolapse  Discharge diagnosis: same  History and Physical: For full details, please see admission history and physical. Briefly, Mandy Guzman is a 73 y.o. year old patient with pelvic organ prolapse.   Hospital Course: The patient was taken for Robotic assisted laparoscopic sacrocolpopexy on 12/23/23. She tolerated the procedure well and was taken to the floor for routine post operative care.   On POD1, her vaginal packing was removed and Foley catheter removed. By POD1, she met all discharge criteria with pain well controlled, ambulating, and tolerating regular diet. Prior to discharge, her H/H was rechecked to ensure that is stabilized.***  Laboratory values:  Recent Labs    12/23/23 1215 12/24/23 0409  HGB 10.3* 8.7*  HCT 33.1* 26.6*   Recent Labs    12/24/23 0409  CREATININE 0.87    Disposition: Home  Discharge instruction: The patient was instructed to be ambulatory but told to refrain from heavy lifting, strenuous activity, or driving.   Discharge medications:  Allergies as of 12/24/2023       Reactions   Amoxicillin  Other (See Comments)   headache/anxiety (unsure of this medication intolerance per patient)   Erythromycin Base    REACTION: up set stomach   Sulfamethoxazole Nausea And Vomiting        Medication List     STOP taking these medications    BIOTIN PO   multivitamin with minerals tablet   VITAMIN D PO       TAKE these medications    ALIGN PO Take 1 capsule by mouth in the morning.   cephALEXin 500 MG capsule Commonly known as: KEFLEX Take 1 capsule (500 mg total) by mouth 2 (two) times daily.   diphenhydrAMINE 25 MG tablet Commonly known as: BENADRYL Take 25 mg by mouth 2 (two) times daily as needed for allergies.   docusate sodium 100 MG capsule Commonly known as: COLACE Take 1 capsule (100 mg total) by mouth 2 (two) times  daily.   estradiol 0.1 MG/GM vaginal cream Commonly known as: ESTRACE Place 1 Applicatorful vaginally daily. Apply 1 applicatorful 3 times weekly on Mondays, Wednesdays & Fridays at night. Apply a pea sized amount on Sundays, Tuesdays, Thursdays & Saturdays at night.   fluticasone 50 MCG/ACT nasal spray Commonly known as: FLONASE Place 1 spray into both nostrils daily as needed for allergies.   K-Y EX Place 1 Application vaginally as needed (irritation (bladder prolapse)).   omeprazole 20 MG capsule Commonly known as: PRILOSEC Take 20 mg by mouth daily before breakfast.   polyethylene glycol 17 g packet Commonly known as: MIRALAX / GLYCOLAX Take 17 g by mouth at bedtime as needed (constipation).   pseudoephedrine 30 MG tablet Commonly known as: SUDAFED Take 60 mg by mouth every 6 (six) hours as needed for congestion.   REPLENS VA Place 1 Dose vaginally as needed (irritation (bladder prolapse)).   traMADol  50 MG tablet Commonly known as: Ultram  Take 1-2 tablets (50-100 mg total) by mouth every 6 (six) hours as needed for moderate pain (pain score 4-6) or severe pain (pain score 7-10).        Followup:   Follow-up Information     Buck Rodena BIRCH, NP Follow up on 01/06/2024.   Specialty: Urology Why: at 10:30 Contact information: [address] [place] [place] [address] [phone]

## 2023-12-24 NOTE — Progress Notes (Signed)
 Foley and Vaginal packing removed at 5:00 am. Pt tolerated it well. We walked a lap around the unit on [address]. She is stable on her feet and stated she feels good.

## 2023-12-31 DIAGNOSIS — Z9889 Other specified postprocedural states: Secondary | ICD-10-CM | POA: Diagnosis not present

## 2023-12-31 DIAGNOSIS — N814 Uterovaginal prolapse, unspecified: Secondary | ICD-10-CM | POA: Diagnosis not present

## 2024-01-06 DIAGNOSIS — N3946 Mixed incontinence: Secondary | ICD-10-CM | POA: Diagnosis not present

## 2024-01-06 DIAGNOSIS — N8111 Cystocele, midline: Secondary | ICD-10-CM | POA: Diagnosis not present

## 2024-02-03 DIAGNOSIS — N8111 Cystocele, midline: Secondary | ICD-10-CM | POA: Diagnosis not present

## 2024-02-03 DIAGNOSIS — N3946 Mixed incontinence: Secondary | ICD-10-CM | POA: Diagnosis not present
